# Patient Record
Sex: Female | Born: 1985 | Race: Black or African American | Hispanic: No | Marital: Single | Smoking: Former smoker
Health system: Southern US, Community
[De-identification: ages and names within clinical notes are randomized; demographics above are authoritative.]

## PROBLEM LIST (undated history)

## (undated) DIAGNOSIS — N189 Chronic kidney disease, unspecified: Secondary | ICD-10-CM

## (undated) HISTORY — PX: BREAST SURGERY: SHX581

## (undated) HISTORY — PX: WISDOM TOOTH EXTRACTION: SHX21

---
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
## 2010-08-28 ENCOUNTER — Emergency Department (HOSPITAL_COMMUNITY)
Admission: EM | Admit: 2010-08-28 | Discharge: 2010-08-28 | Disposition: A | Discharge status: Home / Self Care | Payer: Medicaid Other | Attending: Emergency Medicine | Admitting: Emergency Medicine

## 2010-08-28 DIAGNOSIS — N390 Urinary tract infection, site not specified: Secondary | ICD-10-CM | POA: Insufficient documentation

## 2010-08-28 DIAGNOSIS — N898 Other specified noninflammatory disorders of vagina: Secondary | ICD-10-CM | POA: Insufficient documentation

## 2010-08-28 DIAGNOSIS — Z202 Contact with and (suspected) exposure to infections with a predominantly sexual mode of transmission: Secondary | ICD-10-CM | POA: Insufficient documentation

## 2010-08-28 DIAGNOSIS — Z3201 Encounter for pregnancy test, result positive: Secondary | ICD-10-CM | POA: Insufficient documentation

## 2010-08-28 DIAGNOSIS — O2390 Unspecified genitourinary tract infection in pregnancy, unspecified trimester: Secondary | ICD-10-CM | POA: Insufficient documentation

## 2010-08-28 LAB — URINALYSIS, ROUTINE W REFLEX MICROSCOPIC
Bilirubin Urine: NEGATIVE
Glucose, UA: NEGATIVE mg/dL
Hgb urine dipstick: NEGATIVE
Ketones, ur: NEGATIVE mg/dL
Nitrite: POSITIVE — AB
Protein, ur: NEGATIVE mg/dL
Specific Gravity, Urine: 1.023 (ref 1.005–1.030)
Urobilinogen, UA: 1 mg/dL (ref 0.0–1.0)
pH: 6.5 (ref 5.0–8.0)

## 2010-08-28 LAB — WET PREP, GENITAL
Clue Cells Wet Prep HPF POC: NONE SEEN
Trich, Wet Prep: NONE SEEN
Yeast Wet Prep HPF POC: NONE SEEN

## 2010-08-28 LAB — URINE MICROSCOPIC-ADD ON

## 2010-08-28 LAB — POCT PREGNANCY, URINE: Preg Test, Ur: POSITIVE

## 2013-09-16 ENCOUNTER — Emergency Department (HOSPITAL_BASED_OUTPATIENT_CLINIC_OR_DEPARTMENT_OTHER)
Admission: EM | Admit: 2013-09-16 | Discharge: 2013-09-16 | Disposition: A | Discharge status: Home / Self Care | Payer: Medicaid Other | Attending: Emergency Medicine | Admitting: Emergency Medicine

## 2013-09-16 ENCOUNTER — Encounter (HOSPITAL_BASED_OUTPATIENT_CLINIC_OR_DEPARTMENT_OTHER): Payer: Self-pay | Admitting: Emergency Medicine

## 2013-09-16 ENCOUNTER — Emergency Department (HOSPITAL_BASED_OUTPATIENT_CLINIC_OR_DEPARTMENT_OTHER): Payer: Medicaid Other

## 2013-09-16 DIAGNOSIS — N760 Acute vaginitis: Secondary | ICD-10-CM | POA: Insufficient documentation

## 2013-09-16 DIAGNOSIS — O200 Threatened abortion: Secondary | ICD-10-CM | POA: Diagnosis not present

## 2013-09-16 DIAGNOSIS — Z87891 Personal history of nicotine dependence: Secondary | ICD-10-CM | POA: Diagnosis not present

## 2013-09-16 DIAGNOSIS — O368990 Maternal care for other specified fetal problems, unspecified trimester, not applicable or unspecified: Secondary | ICD-10-CM | POA: Diagnosis not present

## 2013-09-16 DIAGNOSIS — B9689 Other specified bacterial agents as the cause of diseases classified elsewhere: Secondary | ICD-10-CM | POA: Diagnosis not present

## 2013-09-16 DIAGNOSIS — Z880 Allergy status to penicillin: Secondary | ICD-10-CM | POA: Diagnosis not present

## 2013-09-16 DIAGNOSIS — O2390 Unspecified genitourinary tract infection in pregnancy, unspecified trimester: Secondary | ICD-10-CM | POA: Diagnosis not present

## 2013-09-16 DIAGNOSIS — B373 Candidiasis of vulva and vagina: Secondary | ICD-10-CM | POA: Insufficient documentation

## 2013-09-16 DIAGNOSIS — O468X1 Other antepartum hemorrhage, first trimester: Secondary | ICD-10-CM

## 2013-09-16 DIAGNOSIS — O98819 Other maternal infectious and parasitic diseases complicating pregnancy, unspecified trimester: Secondary | ICD-10-CM | POA: Diagnosis not present

## 2013-09-16 DIAGNOSIS — A499 Bacterial infection, unspecified: Secondary | ICD-10-CM | POA: Diagnosis not present

## 2013-09-16 DIAGNOSIS — O418X10 Other specified disorders of amniotic fluid and membranes, first trimester, not applicable or unspecified: Secondary | ICD-10-CM

## 2013-09-16 DIAGNOSIS — Z3491 Encounter for supervision of normal pregnancy, unspecified, first trimester: Secondary | ICD-10-CM

## 2013-09-16 DIAGNOSIS — O43899 Other placental disorders, unspecified trimester: Secondary | ICD-10-CM | POA: Diagnosis not present

## 2013-09-16 DIAGNOSIS — N390 Urinary tract infection, site not specified: Secondary | ICD-10-CM | POA: Insufficient documentation

## 2013-09-16 DIAGNOSIS — B3731 Acute candidiasis of vulva and vagina: Secondary | ICD-10-CM | POA: Insufficient documentation

## 2013-09-16 DIAGNOSIS — O2341 Unspecified infection of urinary tract in pregnancy, first trimester: Secondary | ICD-10-CM

## 2013-09-16 LAB — CBC WITH DIFFERENTIAL/PLATELET
BASOS PCT: 0 % (ref 0–1)
Basophils Absolute: 0 10*3/uL (ref 0.0–0.1)
EOS PCT: 1 % (ref 0–5)
Eosinophils Absolute: 0.1 10*3/uL (ref 0.0–0.7)
HEMATOCRIT: 40.2 % (ref 36.0–46.0)
HEMOGLOBIN: 14 g/dL (ref 12.0–15.0)
Lymphocytes Relative: 25 % (ref 12–46)
Lymphs Abs: 3.2 10*3/uL (ref 0.7–4.0)
MCH: 32.3 pg (ref 26.0–34.0)
MCHC: 34.8 g/dL (ref 30.0–36.0)
MCV: 92.8 fL (ref 78.0–100.0)
MONO ABS: 0.8 10*3/uL (ref 0.1–1.0)
MONOS PCT: 6 % (ref 3–12)
NEUTROS ABS: 8.6 10*3/uL — AB (ref 1.7–7.7)
Neutrophils Relative %: 68 % (ref 43–77)
Platelets: 235 10*3/uL (ref 150–400)
RBC: 4.33 MIL/uL (ref 3.87–5.11)
RDW: 13.3 % (ref 11.5–15.5)
WBC: 12.7 10*3/uL — ABNORMAL HIGH (ref 4.0–10.5)

## 2013-09-16 LAB — URINALYSIS, ROUTINE W REFLEX MICROSCOPIC
Bilirubin Urine: NEGATIVE
Glucose, UA: NEGATIVE mg/dL
Hgb urine dipstick: NEGATIVE
Ketones, ur: NEGATIVE mg/dL
Nitrite: NEGATIVE
Protein, ur: NEGATIVE mg/dL
Specific Gravity, Urine: 1.014 (ref 1.005–1.030)
Urobilinogen, UA: 0.2 mg/dL (ref 0.0–1.0)
pH: 7.5 (ref 5.0–8.0)

## 2013-09-16 LAB — COMPREHENSIVE METABOLIC PANEL
ALBUMIN: 3.7 g/dL (ref 3.5–5.2)
ALT: 12 U/L (ref 0–35)
ANION GAP: 14 (ref 5–15)
AST: 19 U/L (ref 0–37)
Alkaline Phosphatase: 55 U/L (ref 39–117)
BUN: 13 mg/dL (ref 6–23)
CALCIUM: 10.2 mg/dL (ref 8.4–10.5)
CHLORIDE: 99 meq/L (ref 96–112)
CO2: 25 meq/L (ref 19–32)
CREATININE: 0.8 mg/dL (ref 0.50–1.10)
GFR calc Af Amer: >90 mL/min (ref >90)
Glucose, Bld: 113 mg/dL — ABNORMAL HIGH (ref 70–99)
Potassium: 3.6 mEq/L — ABNORMAL LOW (ref 3.7–5.3)
Sodium: 138 mEq/L (ref 137–147)
Total Protein: 7.7 g/dL (ref 6.0–8.3)

## 2013-09-16 LAB — ABO/RH: ABO/RH(D): O POS

## 2013-09-16 LAB — HCG, QUANTITATIVE, PREGNANCY: HCG, BETA CHAIN, QUANT, S: 45988 m[IU]/mL — AB (ref <5)

## 2013-09-16 LAB — URINE MICROSCOPIC-ADD ON

## 2013-09-16 LAB — WET PREP, GENITAL: TRICH WET PREP: NONE SEEN

## 2013-09-16 LAB — PREGNANCY, URINE: Preg Test, Ur: POSITIVE — AB

## 2013-09-16 MED ORDER — LIDOCAINE HCL (PF) 1 % IJ SOLN
INTRAMUSCULAR | Status: AC
  Administered 2013-09-16: 2.1 mL
  Filled 2013-09-16: qty 5

## 2013-09-16 MED ORDER — METRONIDAZOLE 500 MG PO TABS: 500.0000 mg | ORAL_TABLET | Freq: Two times a day (BID) | ORAL | Status: DC

## 2013-09-16 MED ORDER — CLOTRIMAZOLE 1 % VA CREA: 1.0000 | TOPICAL_CREAM | Freq: Every day | VAGINAL | Status: DC

## 2013-09-16 MED ORDER — PRENATAL COMPLETE 14-0.4 MG PO TABS: 1.0000 | ORAL_TABLET | Freq: Every day | ORAL | Status: DC

## 2013-09-16 MED ORDER — AZITHROMYCIN 250 MG PO TABS
1000.0000 mg | ORAL_TABLET | Freq: Once | ORAL | Status: AC
  Administered 2013-09-16: 1000 mg via ORAL
  Filled 2013-09-16: qty 4

## 2013-09-16 MED ORDER — CEFTRIAXONE SODIUM 250 MG IJ SOLR
250.0000 mg | Freq: Once | INTRAMUSCULAR | Status: AC
  Administered 2013-09-16: 250 mg via INTRAMUSCULAR
  Filled 2013-09-16: qty 250

## 2013-09-16 MED ORDER — NITROFURANTOIN MONOHYD MACRO 100 MG PO CAPS
100.0000 mg | ORAL_CAPSULE | Freq: Two times a day (BID) | ORAL | Status: DC
Start: 2013-09-16 — End: 2014-04-16

## 2013-09-16 NOTE — ED Notes (Signed)
Pt presents to ED today with complaints of vaginal bleeding and [redacted] weeks pregnant. Pt states she is spotting since Friday.  G4 P3..Erin Leonard

## 2013-09-16 NOTE — ED Provider Notes (Signed)
CSN: [id number]     Arrival date & time 09/16/13  1219 History   First MD Initiated Contact with Patient 09/16/13 1418     Chief Complaint  Patient presents with  . Vaginal Bleeding  . Possible Pregnancy     (Consider location/radiation/quality/duration/timing/severity/associated sxs/prior Treatment) HPI Comments: Erin Leonard is a 28 y.o. G4P3 Female with no significant PMHx presenting today with complaints of vaginal bleeding that began Friday, and concerns of passage of tissue that began yesterday. She states that her last menstrual period was on 07/28/13, she is currently sexually active with one partner, and it is unprotected sex. Did not take a home pregnancy test prior to arrival and is unsure if she's pregnant. States that the vaginal bleeding is spotting, filling up <1 pad per day, and less than menses. States that the tissue looks like pink stringy tissue, denies any blood clots. She notices this bleeding every time she wipes, and it occurs spontaneously. Associated with the vaginal bleeding is mild intermittent nonradiating lower abdominal cramping and a back ache that began today. She does endorse that there is some vaginal discharge, although she's not sure if this is the tissue or any other discharge. She denies any vaginal itching or odors. Does endorse that her vaginal feels like there's pressure inside of it. She has not tried anything for her symptoms. She denies any bleeding disorders, gynecologic surgery, or terminated pregnancies. She denies any fevers, chills, dizziness, lightheadedness, chest pain, shortness of breath, dysuria, hematuria, dyspareunia, nausea, vomiting, diarrhea, fetal movement, breast tenderness, lactation, rashes, myalgias, or arthralgias. Does not know her blood type.  Patient is a 28 y.o. female presenting with vaginal bleeding. The history is provided by the patient. No language interpreter was used.  Vaginal Bleeding Quality:  Passed tissue and lighter  than menses Severity:  Mild Onset quality:  Sudden Duration:  2 days Timing:  Constant Progression:  Unchanged Chronicity:  New Menstrual history:  Missed period Number of pads used:  1 Number of tampons used:  0 Possible pregnancy: yes   Context: spontaneously   Relieved by:  None tried Worsened by:  Nothing tried Ineffective treatments:  None tried Associated symptoms: abdominal pain (low abd cramping), back pain (low back aching) and vaginal discharge   Associated symptoms: no dizziness, no dyspareunia, no dysuria, no fatigue, no fever and no nausea   Risk factors: unprotected sex   Risk factors: no bleeding disorder, no gynecological surgery, no new sexual partner and no terminated pregnancies     History reviewed. No pertinent past medical history. Past Surgical History  Procedure Laterality Date  . Breast surgery     History reviewed. No pertinent family history. History  Substance Use Topics  . Smoking status: Former Games developer  . Smokeless tobacco: Not on file  . Alcohol Use: Not on file   OB History   Grav Para Term Preterm Abortions TAB SAB Ect Mult Living                 Review of Systems  Constitutional: Negative for fever, chills and fatigue.  Respiratory: Negative for shortness of breath.   Cardiovascular: Negative for chest pain.  Gastrointestinal: Positive for abdominal pain (low abd cramping). Negative for nausea, vomiting, diarrhea, constipation, blood in stool and abdominal distention.  Genitourinary: Positive for vaginal bleeding, vaginal discharge, menstrual problem (missed period) and pelvic pain (lower abd cramping). Negative for dysuria, urgency, frequency, hematuria, flank pain, decreased urine volume, difficulty urinating, vaginal pain and dyspareunia.  Musculoskeletal: Positive  for back pain (low back aching). Negative for arthralgias and myalgias.  Skin: Negative for color change.  Neurological: Negative for dizziness, weakness, light-headedness  and headaches.  Hematological: Does not bruise/bleed easily.  Psychiatric/Behavioral: Negative for confusion.  10 Systems reviewed and are negative for acute change except as noted in the HPI.     Allergies  Penicillins  Home Medications   Prior to Admission medications   Medication Sig Start Date End Date Taking? Authorizing Provider  Prenatal Vit-Fe Fumarate-FA (PRENATAL MULTIVITAMIN) TABS tablet Take 1 tablet by mouth daily at 12 noon.   Yes Historical Provider, MD  clotrimazole (GYNE-LOTRIMIN) 1 % vaginal cream Place 1 Applicatorful vaginally at bedtime. For 7 days. 09/16/13   Caitlynne Harbeck Strupp Camprubi-Soms, PA-C  metroNIDAZOLE (FLAGYL) 500 MG tablet Take 1 tablet (500 mg total) by mouth 2 (two) times daily. One po bid x 7 days 09/16/13   Donnita Falls Camprubi-Soms, PA-C  nitrofurantoin, macrocrystal-monohydrate, (MACROBID) 100 MG capsule Take 1 capsule (100 mg total) by mouth 2 (two) times daily. X 7 days 09/16/13   Donnita Falls Camprubi-Soms, PA-C  Prenatal Vit-Fe Fumarate-FA (PRENATAL COMPLETE) 14-0.4 MG TABS Take 1 tablet by mouth daily after breakfast. 09/16/13   Donnita Falls Camprubi-Soms, PA-C   BP 115/63  Pulse 78  Temp(Src) 98.7 F (37.1 C) (Oral)  Resp 18  Ht 5\' 3"  (1.6 m)  Wt 150 lb (68.04 kg)  BMI 26.58 kg/m2  SpO2 100%  LMP 07/28/2013 Physical Exam  Nursing note and vitals reviewed. Constitutional: She is oriented to person, place, and time. Vital signs are normal. She appears well-developed and well-nourished.  Non-toxic appearance. No distress.  Afebrile, nontoxic, VSS, NAD  HENT:  Head: Normocephalic and atraumatic.  Mouth/Throat: Mucous membranes are normal.  Eyes: Conjunctivae and EOM are normal. Pupils are equal, round, and reactive to light. Right eye exhibits no discharge. Left eye exhibits no discharge.  Neck: Normal range of motion. Neck supple.  Cardiovascular: Normal rate, regular rhythm, normal heart sounds and intact distal pulses.   No murmur  heard. Pulmonary/Chest: Effort normal and breath sounds normal. No respiratory distress. She has no decreased breath sounds. She has no wheezes. She has no rhonchi. She has no rales.  Abdominal: Soft. Normal appearance and bowel sounds are normal. She exhibits no distension. There is tenderness in the suprapubic area. There is no rigidity, no rebound, no guarding, no CVA tenderness, no tenderness at McBurney's point and negative Murphy's sign.  Soft, nondistended, +BS throughout. Mild TTP at pelvic brim, near suprapubic region and into RLQ but with neg McBurney's point TTP. No r/g/r, no peritoneal signs. No CVA TTP  Genitourinary: Pelvic exam was performed with patient supine. There is no rash, tenderness, lesion or injury on the right labia. There is no rash, tenderness, lesion or injury on the left labia. Uterus is deviated and tender. Uterus is not enlarged and not fixed. Cervix exhibits discharge. Cervix exhibits no motion tenderness and no friability. Right adnexum displays no mass, no tenderness and no fullness. Left adnexum displays no mass, no tenderness and no fullness. No erythema or bleeding around the vagina. Vaginal discharge found.  Thick mucoid discharge within vaginal vault. No vaginal bleeding noted. No erythema of vaginal walls. Bilateral adnexa free of tenderness, fullness, or masses. No CMT or cervical friability, but with cervical discharge consistent with discharge seen in vaginal vault. Cervical os appears closed. There is no tissue noted at the cervical os. Uterus does appear to be slightly deviated to the right, but  is not fixed. Cannot feel any enlargement. It is mildly tender to palpation in the same place that she was tender prior to the internal digital exam, suprapubically.  Musculoskeletal: Normal range of motion.  Neurological: She is alert and oriented to person, place, and time.  Skin: Skin is warm, dry and intact. No rash noted. No pallor.  Psychiatric: She has a normal  mood and affect.    ED Course  Procedures (including critical care time) Labs Review Labs Reviewed  WET PREP, GENITAL - Abnormal; Notable for the following:    Yeast Wet Prep HPF POC FEW (*)    Clue Cells Wet Prep HPF POC FEW (*)    WBC, Wet Prep HPF POC TOO NUMEROUS TO COUNT (*)    All other components within normal limits  PREGNANCY, URINE - Abnormal; Notable for the following:    Preg Test, Ur POSITIVE (*)    All other components within normal limits  URINALYSIS, ROUTINE W REFLEX MICROSCOPIC - Abnormal; Notable for the following:    APPearance TURBID (*)    Leukocytes, UA TRACE (*)    All other components within normal limits  URINE MICROSCOPIC-ADD ON - Abnormal; Notable for the following:    Squamous Epithelial / LPF FEW (*)    Bacteria, UA MANY (*)    All other components within normal limits  HCG, QUANTITATIVE, PREGNANCY - Abnormal; Notable for the following:    hCG, Beta Chain, Quant, S 09604 (*)    All other components within normal limits  CBC WITH DIFFERENTIAL - Abnormal; Notable for the following:    WBC 12.7 (*)    Neutro Abs 8.6 (*)    All other components within normal limits  COMPREHENSIVE METABOLIC PANEL - Abnormal; Notable for the following:    Potassium 3.6 (*)    Glucose, Bld 113 (*)    Total Bilirubin <0.2 (*)    All other components within normal limits  GC/CHLAMYDIA PROBE AMP  URINE CULTURE  ABO/RH    Imaging Review US Ob Comp Less 14 Wks  09/16/2013   CLINICAL DATA:  Vaginal bleeding. Positive urine pregnancy test. Abdominal cramping. Seven weeks 1 day pregnant by last menstrual period. Quantitative beta HCG pending.  EXAM: OBSTETRIC <14 WK [place] AND TRANSVAGINAL OB US  TECHNIQUE: Both transabdominal and transvaginal ultrasound examinations were performed for complete evaluation of the gestation as well as the maternal uterus, adnexal regions, and pelvic cul-de-sac. Transvaginal technique was performed to assess early pregnancy.  COMPARISON:  None.   FINDINGS: Intrauterine gestational sac: Visualized/normal in shape.  Yolk sac:  Visualized/normal in shape.  Embryo:  Visualized  Cardiac Activity: Visualized  Heart Rate:  143 bpm  CRL:   10.8  mm   7 w 1 d                  [place] EDC: 05/04/2014  Maternal uterus/adnexae: Moderate-sized subchorionic hemorrhage. Small amount of free peritoneal fluid. Right ovarian corpus luteum. Normal left ovary transabdominally, not seen transvaginally.  IMPRESSION: 1. Single live intrauterine gestation with an estimated gestational age of [redacted] weeks 1 day. 2. Moderate-sized subchorionic hemorrhage. 3. Small amount of free peritoneal fluid.   Electronically Signed   By: Gordan Payment M.D.   On: 09/16/2013 16:33   US Ob Transvaginal  09/16/2013   CLINICAL DATA:  Vaginal bleeding. Positive urine pregnancy test. Abdominal cramping. Seven weeks 1 day pregnant by last menstrual period. Quantitative beta HCG pending.  EXAM: OBSTETRIC <14 WK [place] AND TRANSVAGINAL  OB US  TECHNIQUE: Both transabdominal and transvaginal ultrasound examinations were performed for complete evaluation of the gestation as well as the maternal uterus, adnexal regions, and pelvic cul-de-sac. Transvaginal technique was performed to assess early pregnancy.  COMPARISON:  None.  FINDINGS: Intrauterine gestational sac: Visualized/normal in shape.  Yolk sac:  Visualized/normal in shape.  Embryo:  Visualized  Cardiac Activity: Visualized  Heart Rate:  143 bpm  CRL:   10.8  mm   7 w 1 d                  [place] EDC: 05/04/2014  Maternal uterus/adnexae: Moderate-sized subchorionic hemorrhage. Small amount of free peritoneal fluid. Right ovarian corpus luteum. Normal left ovary transabdominally, not seen transvaginally.  IMPRESSION: 1. Single live intrauterine gestation with an estimated gestational age of [redacted] weeks 1 day. 2. Moderate-sized subchorionic hemorrhage. 3. Small amount of free peritoneal fluid.   Electronically Signed   By: Gordan Payment M.D.   On: 09/16/2013 16:33     EKG  Interpretation None      MDM   Final diagnoses:  Intrauterine normal pregnancy, first trimester  BV (bacterial vaginosis)  Yeast vaginitis  UTI in pregnancy, antepartum, first trimester  Threatened abortion in early pregnancy  Subchorionic hemorrhage in first trimester    27y/o G4P3 here for vaginal bleeding and unsure of pregnancy. Upreg obtained was positive, proceed with quant hcg. Labs ordered and U/S ordered. Pt stable and with VSS, doubt large blood loss. Will reassess after labs return.  5:00 PM Labs as below.  U/A: trace leuks, few squamous cells, many bacteria, but 0-2 WBC. Given that pt is pregnant, although I feel this may be a dirty catch, I will still treat for UTI and send for culture. Will start macrobid x7 days. CBC w/diff: mildly bumped WBC, doubt any infectious source given that pt is afebrile and nontoxic, do not feel this WBC count is significant; stable H/H CMP: K 3.6, no need to treat at this time. All other labs WNL U/S: single live IUP approx [redacted]w[redacted]d, with moderate subchorionic hemorrhage which could be the source of pt's bleeding. Discussed with pt that this could potentially lead to a miscarriage in the future but that the management is "watching and waiting". Will have her f/up with OBGYN for repeat eval. Discussed starting prenatal vitamins today.  6:15 PM Wet prep: +yeast, will tx with clotrimazole cream x7 days; +clue cells, will tx with metro 500mg  BID x7 d GC/CT: given that pt had significant amount of discharge on exam, and is pregnant, will tx today with rocephin 250mg  IM and 1g azithro Rh: pt is O+, no rhogam necessary Quant HCG: currently over the discriminatory zone, and within normal range for [redacted]w[redacted]d gestation. Will have pt go to OBGYN in 2 days for repeat draw to eval for proper rising level  Discussed all of the above findings with the pt. Pt has had care at [facility] in the past, therefore will refer back to them for prenatal care. Discussed that the  lab will call her if her results are positive. Will need to have repeat quant hcg in 2 days, as well as prenatal care. I explained the diagnosis and have given explicit precautions to return to the ER including for any other new or worsening symptoms. The patient understands and accepts the medical plan as it's been dictated and I have answered their questions. Discharge instructions concerning home care and prescriptions have been given. The patient is STABLE and is  discharged to home in good condition.  BP 115/63  Pulse 78  Temp(Src) 98.7 F (37.1 C) (Oral)  Resp 18  Ht 5\' 3"  (1.6 m)  Wt 150 lb (68.04 kg)  BMI 26.58 kg/m2  SpO2 100%  LMP 07/28/2013  Meds ordered this encounter  Medications  . Prenatal Vit-Fe Fumarate-FA (PRENATAL MULTIVITAMIN) TABS tablet    Sig: Take 1 tablet by mouth daily at 12 noon.  Marland Kitchen. azithromycin (ZITHROMAX) tablet 1,000 mg    Sig:   . cefTRIAXone (ROCEPHIN) injection 250 mg    Sig:     Order Specific Question:  Antibiotic Indication:    Answer:  STD  . nitrofurantoin, macrocrystal-monohydrate, (MACROBID) 100 MG capsule    Sig: Take 1 capsule (100 mg total) by mouth 2 (two) times daily. X 7 days    Dispense:  14 capsule    Refill:  0    Order Specific Question:  Supervising Provider    Answer:  Eber HongMILLER, BRIAN D [[id number]]  . metroNIDAZOLE (FLAGYL) 500 MG tablet    Sig: Take 1 tablet (500 mg total) by mouth 2 (two) times daily. One po bid x 7 days    Dispense:  14 tablet    Refill:  0    Order Specific Question:  Supervising Provider    Answer:  Eber HongMILLER, BRIAN D [[id number]]  . clotrimazole (GYNE-LOTRIMIN) 1 % vaginal cream    Sig: Place 1 Applicatorful vaginally at bedtime. For 7 days.    Dispense:  45 g    Refill:  0    Order Specific Question:  Supervising Provider    Answer:  Eber HongMILLER, BRIAN D [[id number]]  . Prenatal Vit-Fe Fumarate-FA (PRENATAL COMPLETE) 14-0.4 MG TABS    Sig: Take 1 tablet by mouth daily after breakfast.    Dispense:  30 each    Refill:  2     Order Specific Question:  Supervising Provider    Answer:  Eber HongMILLER, BRIAN D [[id number]]  . lidocaine (PF) (XYLOCAINE) 1 % injection    Sig:     Mabe, Steven   : cabinet override       Donnita FallsMercedes Strupp Camprubi-Soms, PA-C 09/16/13 1908

## 2013-09-16 NOTE — Discharge Instructions (Signed)
Your pregnancy is within the uterus, but could still be at risk for a subsequent miscarriage since you were found to have bleeding between the uterine wall and placenta. You will need to see the OBGYN in 2 days for a repeat quantitative HCG (pregnancy hormone) and recheck, as well as routine prenatal care. Your urine might have an infection, therefore start taking Macrobid as directed for 7 days. Stay well hydrated. Your vaginal swabs showed yeast, therefore you need to use clotrimazole cream as directed for 7 days. Your swab also showed bacterial vaginosis, therefore start taking Flagyl as directed x 7 days. Take prenatal vitamins daily. Follow up with [facility] STD clinic for future STD concerns or screenings. You have been treated for gonorrhea and chlamydia in the ER but the hospital will call you if lab is positive. If any worsening or changing symptoms occurs, return to the emergency department immediately. Avoid intercourse for the next 48 hours, and get plenty of rest.   Vaginal Bleeding During Pregnancy, First Trimester A small amount of bleeding (spotting) from the vagina is common in early pregnancy. Sometimes the bleeding is normal and is not a problem, and sometimes it is a sign of something serious. Be sure to tell your doctor about any bleeding from your vagina right away. HOME CARE  Watch your condition for any changes.  Follow your doctor's instructions about how active you can be.  If you are on bed rest:  You may need to stay in bed and only get up to use the bathroom.  You may be allowed to do some activities.  If you need help, make plans for someone to help you.  Write down:  The number of pads you use each day.  How often you change pads.  How soaked (saturated) your pads are.  Do not use tampons.  Do not douche.  Do not have sex or orgasms until your doctor says it is okay.  If you pass any tissue from your vagina, save the tissue so  you can show it to your doctor.  Only take medicines as told by your doctor.  Do not take aspirin because it can make you bleed.  Keep all follow-up visits as told by your doctor. GET HELP IF:   You bleed from your vagina.  You have cramps.  You have labor pains.  You have a fever that does not go away after you take medicine. GET HELP RIGHT AWAY IF:   You have very bad cramps in your back or belly (abdomen).  You pass large clots or tissue from your vagina.  You bleed more.  You feel light-headed or weak.  You pass out (faint).  You have chills.  You are leaking fluid or have a gush of fluid from your vagina.  You pass out while pooping (having a bowel movement). MAKE SURE YOU:  Understand these instructions.  Will watch your condition.  Will get help right away if you are not doing well or get worse. Document Released: 06/18/2013 Document Reviewed: 10/09/2012 [facility] Patient Information 2015 [place], [place]. This information is not intended to replace advice given to you by your health care provider. Make sure you discuss any questions you have with your health care provider.  First Trimester of Pregnancy The first trimester of pregnancy is from week 1 until the end of week 12 (months 1 through 3). During this time, your baby will begin to develop inside you. At 6-8 weeks, the eyes and  face are formed, and the heartbeat can be seen on ultrasound. At the end of 12 weeks, all the baby's organs are formed. Prenatal care is all the medical care you receive before the birth of your baby. Make sure you get good prenatal care and follow all of your doctor's instructions. HOME CARE  Medicines  Take medicine only as told by your doctor. Some medicines are safe and some are not during pregnancy.  Take your prenatal vitamins as told by your doctor.  Take medicine that helps you poop (stool softener) as needed if your doctor says it is okay. Diet  Eat regular, healthy  meals.  Your doctor will tell you the amount of weight gain that is right for you.  Avoid raw meat and uncooked cheese.  If you feel sick to your stomach (nauseous) or throw up (vomit):  Eat 4 or 5 small meals a day instead of 3 large meals.  Try eating a few soda crackers.  Drink liquids between meals instead of during meals.  If you have a hard time pooping (constipation):  Eat high-fiber foods like fresh vegetables, fruit, and whole grains.  Drink enough fluids to keep your pee (urine) clear or pale yellow. Activity and Exercise  Exercise only as told by your doctor. Stop exercising if you have cramps or pain in your lower belly (abdomen) or low back.  Try to avoid standing for long periods of time. Move your legs often if you must stand in one place for a long time.  Avoid heavy lifting.  Wear low-heeled shoes. Sit and stand up straight.  You can have sex unless your doctor tells you not to. Relief of Pain or Discomfort  Wear a good support bra if your breasts are sore.  Take warm water baths (sitz baths) to soothe pain or discomfort caused by hemorrhoids. Use hemorrhoid cream if your doctor says it is okay.  Rest with your legs raised if you have leg cramps or low back pain.  Wear support hose if you have puffy, bulging veins (varicose veins) in your legs. Raise (elevate) your feet for 15 minutes, 3-4 times a day. Limit salt in your diet. Prenatal Care  Schedule your prenatal visits by the twelfth week of pregnancy.  Write down your questions. Take them to your prenatal visits.  Keep all your prenatal visits as told by your doctor. Safety  Wear your seat belt at all times when driving.  Make a list of emergency phone numbers. The list should include numbers for family, friends, the hospital, and police and fire departments. General Tips  Ask your doctor for a referral to a local prenatal class. Begin classes no later than at the start of month 6 of your  pregnancy.  Ask for help if you need counseling or help with nutrition. Your doctor can give you advice or tell you where to go for help.  Do not use hot tubs, steam rooms, or saunas.  Do not douche or use tampons or scented sanitary pads.  Do not cross your legs for long periods of time.  Avoid litter boxes and soil used by cats.  Avoid all smoking, herbs, and alcohol. Avoid drugs not approved by your doctor.  Visit your dentist. At home, brush your teeth with a soft toothbrush. Be gentle when you floss. GET HELP IF:  You are dizzy.  You have mild cramps or pressure in your lower belly.  You have a nagging pain in your belly area.  You  continue to feel sick to your stomach, throw up, or have watery poop (diarrhea).  You have a bad smelling fluid coming from your vagina.  You have pain with peeing (urination).  You have increased puffiness (swelling) in your face, hands, legs, or ankles. GET HELP RIGHT AWAY IF:   You have a fever.  You are leaking fluid from your vagina.  You have spotting or bleeding from your vagina.  You have very bad belly cramping or pain.  You gain or lose weight rapidly.  You throw up blood. It may look like coffee grounds.  You are around people who have [place] measles, fifth disease, or chickenpox.  You have a very bad headache.  You have shortness of breath.  You have any kind of trauma, such as from a fall or a car accident. Document Released: 07/21/2007 Document Revised: 06/18/2013 Document Reviewed: 12/12/2012 [facility] Patient Information 2015 [place], [place]. This information is not intended to replace advice given to you by your health care provider. Make sure you discuss any questions you have with your health care provider.  Threatened Miscarriage A threatened miscarriage is when you have vaginal bleeding during your first 20 weeks of pregnancy but the pregnancy has not ended. Your doctor will do tests to make sure you are still  pregnant. The cause of the bleeding may not be known. This condition does not mean your pregnancy will end. It does increase the risk of it ending (complete miscarriage). HOME CARE   Make sure you keep all your doctor visits for prenatal care.  Get plenty of rest.  Do not have sex or use tampons if you have vaginal bleeding.  Do not douche.  Do not smoke or use drugs.  Do not drink alcohol.  Avoid caffeine. GET HELP IF:  You have light bleeding from your vagina.  You have belly pain or cramping.  You have a fever. GET HELP RIGHT AWAY IF:   You have heavy bleeding from your vagina.  You have clots of blood coming from your vagina.  You have bad pain or cramps in your low back or belly.  You have fever, chills, and bad belly pain. MAKE SURE YOU:   Understand these instructions.  Will watch your condition.  Will get help right away if you are not doing well or get worse. Document Released: 01/15/2008 Document Revised: 02/06/2013 Document Reviewed: 11/28/2012 [facility] Patient Information 2015 [place], [place]. This information is not intended to replace advice given to you by your health care provider. Make sure you discuss any questions you have with your health care provider.  Subchorionic Hematoma A subchorionic hematoma is a gathering of blood between the outer wall of the placenta and the inner wall of the womb (uterus). The placenta is the organ that connects the fetus to the wall of the uterus. The placenta performs the feeding, breathing (oxygen to the fetus), and waste removal (excretory work) of the fetus.  Subchorionic hematoma is the most common abnormality found on a result from ultrasonography done during the first trimester or early second trimester of pregnancy. If there has been little or no vaginal bleeding, early small hematomas usually shrink on their own and do not affect your baby or pregnancy. The blood is gradually absorbed over 1-2 weeks. When bleeding  starts later in pregnancy or the hematoma is larger or occurs in an older pregnant woman, the outcome may not be as good. Larger hematomas may get bigger, which increases the chances for miscarriage. Subchorionic  hematoma also increases the risk of premature detachment of the placenta from the uterus, preterm (premature) labor, and stillbirth. HOME CARE INSTRUCTIONS  Stay on bed rest if your health care provider recommends this. Although bed rest will not prevent more bleeding or prevent a miscarriage, your health care provider may recommend bed rest until you are advised otherwise.  Avoid heavy lifting (more than 10 lb [4.5 kg]), exercise, sexual intercourse, or douching as directed by your health care provider.  Keep track of the number of pads you use each day and how soaked (saturated) they are. Write down this information.  Do not use tampons.  Keep all follow-up appointments as directed by your health care provider. Your health care provider may ask you to have follow-up blood tests or ultrasound tests or both. SEEK IMMEDIATE MEDICAL CARE IF:  You have severe cramps in your stomach, back, abdomen, or pelvis.  You have a fever.  You pass large clots or tissue. Save any tissue for your health care provider to look at.  Your bleeding increases or you become lightheaded, feel weak, or have fainting episodes. Document Released: 05/19/2006 Document Revised: 06/18/2013 Document Reviewed: 08/31/2012 [facility] Patient Information 2015 [place], [place]. This information is not intended to replace advice given to you by your health care provider. Make sure you discuss any questions you have with your health care provider.  Human Chorionic Gonadotropin (hCG) This is a test to confirm and monitor pregnancy or to diagnose trophoblastic disease or germ cell tumors. As early as 10 days after a missed menstrual period (some methods can detect hCG even earlier, at one week after conception) or if your  caregiver thinks that your symptoms suggest ectopic pregnancy, a failing pregnancy, trophoblastic disease, or germ cell tumors. hCG is a protein produced in the placenta of a pregnant woman. A pregnancy test is a specific blood or urine test that can detect hCG and confirm pregnancy. This hormone is able to be detected 10 days after a missed menstrual period, the time period when the fertilized egg is implanted in the woman's uterus. With some methods, hCG can be detected even earlier, at one week after conception.  During the early weeks of pregnancy, hCG is important in maintaining function of the corpus luteum (the mass of cells that forms from a mature egg). Production of hCG increases steadily during the first trimester (8-10 weeks), peaking around the 10th week after the last menstrual cycle. Levels then fall slowly during the remainder of the pregnancy. hCG is no longer detectable within a few weeks after delivery. hCG is also produced by some germ cell tumors and increased levels are seen in trophoblastic disease. SAMPLE COLLECTION hCG is commonly detected in urine. The preferred specimen is a random urine sample collected first thing in the morning. hCG can also be measured in blood drawn from a vein in the arm. NORMAL FINDINGS Qualitative:negative in non-pregnant women; positive in pregnancy Quantitative:   Gestation less than 1 week: 5-50 Whole HCG (milli-international units/mL)  Gestation of 2 weeks: 50-500 Whole HCG (milli-international units/mL)  Gestation of 3 weeks: 100-10,000 Whole HCG (milli-international units/mL)  Gestation of 4 weeks: 1,000-30,000 Whole HCG (milli-international units/mL)  Gestation of 5 weeks 3,500-115,000 Whole HCG (milli-international units/mL)  Gestation of 6-8 weeks: 12,000-270,000 Whole HCG (milli-international units/mL)  Gestation of 12 weeks: 15,000-220,000 Whole HCG (milli-international units/mL)  Males and non-pregnant females: less than 5 Whole  HCG (milli-international units/mL) Beta subunit: depends on the method and test used Ranges for normal  findings may vary among different laboratories and hospitals. You should always check with your doctor after having lab work or other tests done to discuss the meaning of your test results and whether your values are considered within normal limits. MEANING OF TEST  Your caregiver will go over the test results with you and discuss the importance and meaning of your results, as well as treatment options and the need for additional tests if necessary. OBTAINING THE TEST RESULTS It is your responsibility to obtain your test results. Ask the lab or department performing the test when and how you will get your results. Document Released: 03/05/2004 Document Revised: 04/26/2011 Document Reviewed: 05/07/2013 [facility] Patient Information 2015 [place], [place]. This information is not intended to replace advice given to you by your health care provider. Make sure you discuss any questions you have with your health care provider.  Pregnancy and Urinary Tract Infection A urinary tract infection (UTI) is a bacterial infection of the urinary tract. Infection of the urinary tract can include the ureters, kidneys (pyelonephritis), bladder (cystitis), and urethra (urethritis). All pregnant women should be screened for bacteria in the urinary tract. Identifying and treating a UTI will decrease the risk of preterm labor and developing more serious infections in both the mother and baby. CAUSES Bacteria germs cause almost all UTIs.  RISK FACTORS Many factors can increase your chances of getting a UTI during pregnancy. These include:  Having a short urethra.  Poor toilet and hygiene habits.  Sexual intercourse.  Blockage of urine along the urinary tract.  Problems with the pelvic muscles or nerves.  Diabetes.  Obesity.  Bladder problems after having several children.  Previous history of UTI. SIGNS AND  SYMPTOMS   Pain, burning, or a stinging feeling when urinating.  Suddenly feeling the need to urinate right away (urgency).  Loss of bladder control (urinary incontinence).  Frequent urination, more than is common with pregnancy.  Lower abdominal or back discomfort.  Cloudy urine.  Blood in the urine (hematuria).  Fever. When the kidneys are infected, the symptoms may be:  Back pain.  Flank pain on the right side more so than the left.  Fever.  Chills.  Nausea.  Vomiting. DIAGNOSIS  A urinary tract infection is usually diagnosed through urine tests. Additional tests and procedures are sometimes done. These may include:  Ultrasound exam of the kidneys, ureters, bladder, and urethra.  Looking in the bladder with a lighted tube (cystoscopy). TREATMENT Typically, UTIs can be treated with antibiotic medicines.  HOME CARE INSTRUCTIONS   Only take over-the-counter or prescription medicines as directed by your health care provider. If you were prescribed antibiotics, take them as directed. Finish them even if you start to feel better.  Drink enough fluids to keep your urine clear or pale yellow.  Do not have sexual intercourse until the infection is gone and your health care provider says it is okay.  Make sure you are tested for UTIs throughout your pregnancy. These infections often come back. Preventing a UTI in the Future  Practice good toilet habits. Always wipe from front to back. Use the tissue only once.  Do not hold your urine. Empty your bladder as soon as possible when the urge comes.  Do not douche or use deodorant sprays.  Wash with soap and warm water around the genital area and the anus.  Empty your bladder before and after sexual intercourse.  Wear underwear with a cotton crotch.  Avoid caffeine and carbonated drinks. They can irritate  the bladder.  Drink cranberry juice or take cranberry pills. This may decrease the risk of getting a UTI.  Do  not drink alcohol.  Keep all your appointments and tests as scheduled. SEEK MEDICAL CARE IF:   Your symptoms get worse.  You are still having fevers 2 or more days after treatment begins.  You have a rash.  You feel that you are having problems with medicines prescribed.  You have abnormal vaginal discharge. SEEK IMMEDIATE MEDICAL CARE IF:   You have back or flank pain.  You have chills.  You have blood in your urine.  You have nausea and vomiting.  You have contractions of your uterus.  You have a gush of fluid from the vagina. MAKE SURE YOU:  Understand these instructions.   Will watch your condition.   Will get help right away if you are not doing well or get worse.  Document Released: 05/29/2010 Document Revised: 11/22/2012 Document Reviewed: 08/31/2012 [facility] Patient Information 2015 [place], [place]. This information is not intended to replace advice given to you by your health care provider. Make sure you discuss any questions you have with your health care provider.  Candidal Vulvovaginitis Candidal vulvovaginitis is an infection of the vagina and vulva. The vulva is the skin around the opening of the vagina. This may cause itching and discomfort in and around the vagina.  HOME CARE  Only take medicine as told by your doctor.  Do not have sex (intercourse) until the infection is healed or as told by your doctor.  Practice safe sex.  Tell your sex partner about your infection.  Do not douche or use tampons.  Wear cotton underwear. Do not wear tight pants or panty hose.  Eat yogurt. This may help treat and prevent yeast infections. GET HELP RIGHT AWAY IF:   You have a fever.  Your problems get worse during treatment or do not get better in 3 days.  You have discomfort, irritation, or itching in your vagina or vulva area.  You have pain after sex.  You start to get belly (abdominal) pain. MAKE SURE YOU:  Understand these  instructions.  Will watch your condition.  Will get help right away if you are not doing well or get worse. Document Released: 04/30/2008 Document Revised: 02/06/2013 Document Reviewed: 04/30/2008 [facility] Patient Information 2015 [place], [place]. This information is not intended to replace advice given to you by your health care provider. Make sure you discuss any questions you have with your health care provider.  Bacterial Vaginosis Bacterial vaginosis is an infection of the vagina. It happens when too many of certain germs (bacteria) grow in the vagina. HOME CARE  Take your medicine as told by your doctor.  Finish your medicine even if you start to feel better.  Do not have sex until you finish your medicine and are better.  Tell your sex partner that you have an infection. They should see their doctor for treatment.  Practice safe sex. Use condoms. Have only one sex partner. GET HELP IF:  You are not getting better after 3 days of treatment.  You have more grey fluid (discharge) coming from your vagina than before.  You have more pain than before.  You have a fever. MAKE SURE YOU:   Understand these instructions.  Will watch your condition.  Will get help right away if you are not doing well or get worse. Document Released: 11/11/2007 Document Revised: 11/22/2012 Document Reviewed: 09/13/2012 [facility] Patient Information 2015 [place], [place].  This information is not intended to replace advice given to you by your health care provider. Make sure you discuss any questions you have with your health care provider.  Sexually Transmitted Disease A sexually transmitted disease (STD) is a disease or infection often passed to another person during sex. However, STDs can be passed through nonsexual ways. An STD can be passed through:  Spit (saliva).  Semen.  Blood.  Mucus from the vagina.  Pee (urine). HOW CAN I LESSEN MY CHANCES OF GETTING AN STD?  Use:  Latex  condoms.  Water-soluble lubricants with condoms. Do not use petroleum jelly or oils.  Dental dams. These are small pieces of latex that are used as a barrier during oral sex.  Avoid having more than one sex partner.  Do not have sex with someone who has other sex partners.  Do not have sex with anyone you do not know or who is at high risk for an STD.  Avoid risky sex that can break your skin.  Do not have sex if you have open sores on your mouth or skin.  Avoid drinking too much alcohol or taking illegal drugs. Alcohol and drugs can affect your good judgment.  Avoid oral and anal sex acts.  Get shots (vaccines) for HPV and hepatitis.  If you are at risk of being infected with HIV, it is advised that you take a certain medicine daily to prevent HIV infection. This is called pre-exposure prophylaxis (PrEP). You may be at risk if:  You are a man who has sex with other men (MSM).  You are attracted to the opposite sex (heterosexual) and are having sex with more than one partner.  You take drugs with a needle.  You have sex with someone who has HIV.  Talk with your doctor about if you are at high risk of being infected with HIV. If you begin to take PrEP, get tested for HIV first. Get tested every 3 months for as long as you are taking PrEP. WHAT SHOULD I DO IF I THINK I HAVE AN STD?  See your doctor.  Tell your sex partner(s) that you have an STD. They should be tested and treated.  Do not have sex until your doctor says it is okay. WHEN SHOULD I GET HELP? Get help right away if:  You have bad belly (abdominal) pain.  You are a man and have puffiness (swelling) or pain in your testicles.  You are a woman and have puffiness in your vagina. Document Released: 03/11/2004 Document Revised: 02/06/2013 Document Reviewed: 07/28/2012 [facility] Patient Information 2015 [place], [place]. This information is not intended to replace advice given to you by your health care provider.  Make sure you discuss any questions you have with your health care provider.

## 2013-09-16 NOTE — ED Notes (Signed)
PT discharged to home with family. NAD. 

## 2013-09-16 NOTE — ED Notes (Signed)
PT taken to ultrasound.  

## 2013-09-17 LAB — GC/CHLAMYDIA PROBE AMP
CT Probe RNA: NEGATIVE
GC PROBE AMP APTIMA: POSITIVE — AB

## 2013-09-18 ENCOUNTER — Telehealth (HOSPITAL_BASED_OUTPATIENT_CLINIC_OR_DEPARTMENT_OTHER): Payer: Self-pay | Admitting: Emergency Medicine

## 2013-09-18 LAB — URINE CULTURE

## 2013-09-18 NOTE — Telephone Encounter (Signed)
+  Gonorrhea. Patient treated with Rocephin and [facility]ax[facility]

## 2013-09-18 NOTE — ED Provider Notes (Signed)
Medical screening examination/treatment/procedure(s) were performed by non-physician practitioner and as supervising physician I was immediately available for consultation/collaboration.   EKG Interpretation None        Junius ArgyleForrest S Deannah Rossi, MD 09/18/13 1328

## 2013-10-03 LAB — OB RESULTS CONSOLE RPR: RPR: NONREACTIVE

## 2013-10-03 LAB — OB RESULTS CONSOLE RUBELLA ANTIBODY, IGM: Rubella: IMMUNE

## 2013-10-03 LAB — OB RESULTS CONSOLE ABO/RH: RH Type: POSITIVE

## 2013-10-03 LAB — OB RESULTS CONSOLE HIV ANTIBODY (ROUTINE TESTING): HIV: NONREACTIVE

## 2013-10-03 LAB — OB RESULTS CONSOLE HEPATITIS B SURFACE ANTIGEN: Hepatitis B Surface Ag: NEGATIVE

## 2013-10-03 LAB — OB RESULTS CONSOLE ANTIBODY SCREEN: ANTIBODY SCREEN: NEGATIVE

## 2013-10-03 LAB — OB RESULTS CONSOLE GC/CHLAMYDIA
Chlamydia: NEGATIVE
GC PROBE AMP, GENITAL: NEGATIVE

## 2013-10-04 DIAGNOSIS — R87619 Unspecified abnormal cytological findings in specimens from cervix uteri: Secondary | ICD-10-CM | POA: Insufficient documentation

## 2014-02-15 NOTE — L&D Delivery Note (Signed)
Delivery Note Pt progressed to complete dilation and pushed great.  At 10:09 PM a healthy female was delivered via  (Presentation: OA ).  APGAR: 8,9 ; weight pending .   Placenta status:delivered spontaneously , .  Cord:  with the following complications:double nuchal cord delivered through and then reduced .    Anesthesia: Epidural  Episiotomy:  none Lacerations: one  Suture Repair: na Est. Blood Loss (mL): 300cc   Mom to postpartum.  Baby to Couplet care / Skin to Skin. D/w pt circumcision and she plans to proceed in office.  Oliver PilaICHARDSON,Narjis Mira W 04/14/2014, 10:20 PM

## 2014-04-14 ENCOUNTER — Inpatient Hospital Stay (HOSPITAL_COMMUNITY): Payer: Medicaid Other | Admitting: Anesthesiology

## 2014-04-14 ENCOUNTER — Encounter (HOSPITAL_COMMUNITY): Payer: Self-pay | Admitting: *Deleted

## 2014-04-14 ENCOUNTER — Inpatient Hospital Stay (HOSPITAL_COMMUNITY)
Admission: AD | Admit: 2014-04-14 | Discharge: 2014-04-16 | DRG: 775 | Disposition: A | Discharge status: Home / Self Care | Payer: Medicaid Other | Source: Ambulatory Visit | Attending: Obstetrics and Gynecology | Admitting: Obstetrics and Gynecology

## 2014-04-14 DIAGNOSIS — Z3A37 37 weeks gestation of pregnancy: Secondary | ICD-10-CM | POA: Diagnosis present

## 2014-04-14 DIAGNOSIS — O99334 Smoking (tobacco) complicating childbirth: Secondary | ICD-10-CM | POA: Diagnosis present

## 2014-04-14 DIAGNOSIS — N858 Other specified noninflammatory disorders of uterus: Secondary | ICD-10-CM | POA: Diagnosis present

## 2014-04-14 DIAGNOSIS — O9989 Other specified diseases and conditions complicating pregnancy, childbirth and the puerperium: Secondary | ICD-10-CM | POA: Diagnosis present

## 2014-04-14 DIAGNOSIS — R87612 Low grade squamous intraepithelial lesion on cytologic smear of cervix (LGSIL): Secondary | ICD-10-CM | POA: Diagnosis present

## 2014-04-14 DIAGNOSIS — F17210 Nicotine dependence, cigarettes, uncomplicated: Secondary | ICD-10-CM | POA: Diagnosis present

## 2014-04-14 DIAGNOSIS — O99824 Streptococcus B carrier state complicating childbirth: Principal | ICD-10-CM | POA: Diagnosis present

## 2014-04-14 DIAGNOSIS — Z880 Allergy status to penicillin: Secondary | ICD-10-CM | POA: Diagnosis not present

## 2014-04-14 DIAGNOSIS — Z23 Encounter for immunization: Secondary | ICD-10-CM

## 2014-04-14 DIAGNOSIS — IMO0001 Reserved for inherently not codable concepts without codable children: Secondary | ICD-10-CM

## 2014-04-14 HISTORY — DX: Chronic kidney disease, unspecified: N18.9

## 2014-04-14 LAB — CBC
HEMATOCRIT: 36.8 % (ref 36.0–46.0)
Hemoglobin: 12 g/dL (ref 12.0–15.0)
MCH: 29.4 pg (ref 26.0–34.0)
MCHC: 32.6 g/dL (ref 30.0–36.0)
MCV: 90.2 fL (ref 78.0–100.0)
PLATELETS: 216 10*3/uL (ref 150–400)
RBC: 4.08 MIL/uL (ref 3.87–5.11)
RDW: 15.2 % (ref 11.5–15.5)
WBC: 12.5 10*3/uL — ABNORMAL HIGH (ref 4.0–10.5)

## 2014-04-14 LAB — ABO/RH: ABO/RH(D): O POS

## 2014-04-14 LAB — TYPE AND SCREEN
ABO/RH(D): O POS
Antibody Screen: NEGATIVE

## 2014-04-14 LAB — OB RESULTS CONSOLE GBS: STREP GROUP B AG: POSITIVE

## 2014-04-14 MED ORDER — PHENYLEPHRINE 40 MCG/ML (10ML) SYRINGE FOR IV PUSH (FOR BLOOD PRESSURE SUPPORT)
80.0000 ug | PREFILLED_SYRINGE | INTRAVENOUS | Status: DC | PRN
  Filled 2014-04-14: qty 2
  Filled 2014-04-14: qty 20

## 2014-04-14 MED ORDER — ONDANSETRON HCL 4 MG/2ML IJ SOLN: 4.0000 mg | Freq: Four times a day (QID) | INTRAMUSCULAR | Status: DC | PRN

## 2014-04-14 MED ORDER — PHENYLEPHRINE 40 MCG/ML (10ML) SYRINGE FOR IV PUSH (FOR BLOOD PRESSURE SUPPORT)
80.0000 ug | PREFILLED_SYRINGE | INTRAVENOUS | Status: DC | PRN
Start: 2014-04-14 — End: 2014-04-15
  Filled 2014-04-14: qty 2

## 2014-04-14 MED ORDER — TERBUTALINE SULFATE 1 MG/ML IJ SOLN
0.2500 mg | Freq: Once | INTRAMUSCULAR | Status: AC | PRN
  Filled 2014-04-14: qty 1

## 2014-04-14 MED ORDER — LIDOCAINE HCL (PF) 1 % IJ SOLN
INTRAMUSCULAR | Status: DC | PRN
  Administered 2014-04-14 (×2): 5 mL

## 2014-04-14 MED ORDER — EPHEDRINE 5 MG/ML INJ
10.0000 mg | INTRAVENOUS | Status: DC | PRN
  Filled 2014-04-14: qty 2

## 2014-04-14 MED ORDER — LACTATED RINGERS IV SOLN
INTRAVENOUS | Status: DC
  Administered 2014-04-14 (×2): via INTRAVENOUS

## 2014-04-14 MED ORDER — LACTATED RINGERS IV SOLN: 500.0000 mL | INTRAVENOUS | Status: DC | PRN

## 2014-04-14 MED ORDER — OXYTOCIN 40 UNITS IN LACTATED RINGERS INFUSION - SIMPLE MED
1.0000 m[IU]/min | INTRAVENOUS | Status: DC
  Administered 2014-04-14: 2 m[IU]/min via INTRAVENOUS
  Administered 2014-04-14: 4 m[IU]/min via INTRAVENOUS

## 2014-04-14 MED ORDER — OXYTOCIN 40 UNITS IN LACTATED RINGERS INFUSION - SIMPLE MED
62.5000 mL/h | INTRAVENOUS | Status: DC
  Filled 2014-04-14: qty 1000

## 2014-04-14 MED ORDER — BUTORPHANOL TARTRATE 1 MG/ML IJ SOLN: 1.0000 mg | INTRAMUSCULAR | Status: DC | PRN

## 2014-04-14 MED ORDER — FENTANYL 2.5 MCG/ML BUPIVACAINE 1/10 % EPIDURAL INFUSION (WH - ANES)
14.0000 mL/h | INTRAMUSCULAR | Status: DC | PRN
  Administered 2014-04-14: 14 mL/h via EPIDURAL
  Filled 2014-04-14: qty 125

## 2014-04-14 MED ORDER — LIDOCAINE HCL (PF) 1 % IJ SOLN
30.0000 mL | INTRAMUSCULAR | Status: DC | PRN
  Filled 2014-04-14: qty 30

## 2014-04-14 MED ORDER — OXYCODONE-ACETAMINOPHEN 5-325 MG PO TABS: 1.0000 | ORAL_TABLET | ORAL | Status: DC | PRN

## 2014-04-14 MED ORDER — DIPHENHYDRAMINE HCL 50 MG/ML IJ SOLN
25.0000 mg | Freq: Once | INTRAMUSCULAR | Status: AC
  Administered 2014-04-14: 25 mg via INTRAVENOUS

## 2014-04-14 MED ORDER — OXYCODONE-ACETAMINOPHEN 5-325 MG PO TABS: 2.0000 | ORAL_TABLET | ORAL | Status: DC | PRN

## 2014-04-14 MED ORDER — ACETAMINOPHEN 325 MG PO TABS: 650.0000 mg | ORAL_TABLET | ORAL | Status: DC | PRN

## 2014-04-14 MED ORDER — LACTATED RINGERS IV SOLN: 500.0000 mL | Freq: Once | INTRAVENOUS | Status: DC

## 2014-04-14 MED ORDER — OXYTOCIN BOLUS FROM INFUSION
500.0000 mL | INTRAVENOUS | Status: DC
  Administered 2014-04-14: 500 mL via INTRAVENOUS

## 2014-04-14 MED ORDER — DIPHENHYDRAMINE HCL 50 MG/ML IJ SOLN
12.5000 mg | INTRAMUSCULAR | Status: DC | PRN
  Filled 2014-04-14: qty 1

## 2014-04-14 MED ORDER — CITRIC ACID-SODIUM CITRATE 334-500 MG/5ML PO SOLN: 30.0000 mL | ORAL | Status: DC | PRN

## 2014-04-14 MED ORDER — VANCOMYCIN HCL IN DEXTROSE 1-5 GM/200ML-% IV SOLN
1000.0000 mg | Freq: Two times a day (BID) | INTRAVENOUS | Status: DC
  Administered 2014-04-14: 1000 mg via INTRAVENOUS
  Filled 2014-04-14 (×3): qty 200

## 2014-04-14 NOTE — Anesthesia Preprocedure Evaluation (Signed)

## 2014-04-14 NOTE — MAU Note (Signed)
[facility]. Will go to 170

## 2014-04-14 NOTE — Progress Notes (Signed)
Patient ID: Erin HurtLakeshia Leonard, female   DOB: [birth year], 29 y.o.   MRN: [id number] Pt comfortable with epidural afeb vss FHR Category 1  AROM clear fluid Cervix 80/6/-2  Will follow progress.

## 2014-04-14 NOTE — Progress Notes (Signed)
Patient ID: Erin HurtLakeshia Leonard, female   DOB: 25-Sep-1985, 29 y.o.   MRN: [id number] Pt comfortable with epidural afeb vss FHR category 1 Cervix 80/6-7/-1  IUPC placed to allow pitocin augmentation Follow progress.

## 2014-04-14 NOTE — MAU Note (Signed)
Pt presents to MAU with complaints of contractions that started this morning around 10am and have gotten stronger throughout the day. Denies any vaginal bleeding or LOF

## 2014-04-14 NOTE — H&P (Signed)
Erin HurtLakeshia Bagnall is a 29 y.o. female G4P3003 at 4837 1/7 weeks (EDD 05/04/14 by LMP c/w 6 week US) presenting for regular contractions in active labor.  Prenatal care complicated by placenta previa which resolved on f/u scan.  +GBS with PCN allergy--clindamycin resistant.   LGSIL pap with colpo consistent, to f/u postpartum.  Maternal Medical History:  Reason for admission: Contractions.   Contractions: Onset was 6-12 hours ago.   Frequency: regular.   Perceived severity is strong.    Fetal activity: Perceived fetal activity is normal.    Prenatal Complications - Diabetes: none.    OB History    Gravida Para Term Preterm AB TAB SAB Ectopic Multiple Living   4 3 3       3     2005 NSVD 6#5oz 2011 NSVD 7# 2013 NSVD 7#5oz  Past Medical History  Diagnosis Date  . Chronic kidney disease     kidney stone in 2015   Past Surgical History  Procedure Laterality Date  . Breast surgery    . Wisdom tooth extraction     Family History: family history is not on file. Social History:  reports that she quit smoking about 6 months ago. Her smoking use included Cigarettes. She does not have any smokeless tobacco history on file. She reports that she does not drink alcohol or use illicit drugs.   Prenatal Transfer Tool  Maternal Diabetes: No Genetic Screening: Declined Maternal Ultrasounds/Referrals: Normal Fetal Ultrasounds or other Referrals:  None Maternal Substance Abuse:  Yes:  Type: Smoker Significant Maternal Medications:  None Significant Maternal Lab Results:  Lab values include: Group B Strep positive Other Comments:  None  ROS  Dilation: 6 Effacement (%): 60 Station: -3 Exam by:: Elie ConferK. Weiss RN Blood pressure 116/70, pulse 76, temperature 98.2 F (36.8 C), resp. rate 18, height 5\' 3"  (1.6 m), weight 86.637 kg (191 lb), last menstrual period 07/28/2013. Maternal Exam:  Uterine Assessment: Contraction strength is firm.  Contraction frequency is regular.   Abdomen: Patient  reports no abdominal tenderness. Fetal presentation: vertex  Introitus: Normal vulva. Normal vagina.    Physical Exam  Constitutional: She appears well-developed and well-nourished.  Cardiovascular: Normal rate.   Respiratory: Effort normal.  GI: Soft.  Genitourinary: Vagina normal and uterus normal.  Neurological: She is alert.  Psychiatric: She has a normal mood and affect.    Prenatal labs: ABO, Rh: --/--/O POS (08/02 1620) Antibody:  negative Rubella:  immune RPR:   neg HBsAg:  neg  HIV:   NR GBS:   Positive and resistant to clindamycin One hour GTT 114 HgbAA LGSIL pap  Assessment/Plan: Pt admitted in active labor.  Will treat +GBS with vancomycin.  Pt gets hives with PCN and unsure if can take cephalosporins.  Will try to get epidural on board.   Oliver PilaRICHARDSON,Karisma Meiser W 04/14/2014, 3:18 PM

## 2014-04-14 NOTE — Progress Notes (Signed)
Patient ID: Erin HurtLakeshia Leonard, female   DOB: April 30, 1985, 29 y.o.   MRN: [id number] Pt admitted and getting increasingly uncomfortable, wants epidural afeb vss FHR category 1 Patient 1cm in office last visit and now at 5cm/70/-2  Will allow epidural Vancomycin going in for +GBS Plan AROM after epidural

## 2014-04-14 NOTE — Anesthesia Procedure Notes (Signed)
Epidural Patient location during procedure: OB Start time: 04/14/2014 4:56 PM  Staffing Anesthesiologist: Brayton CavesJACKSON, Garron Eline Performed by: anesthesiologist   Preanesthetic Checklist Completed: patient identified, site marked, surgical consent, pre-op evaluation, timeout performed, IV checked, risks and benefits discussed and monitors and equipment checked  Epidural Patient position: sitting Prep: site prepped and draped and DuraPrep Patient monitoring: continuous pulse ox and blood pressure Approach: midline Location: L3-L4 Injection technique: LOR air  Needle:  Needle type: Tuohy  Needle gauge: 17 G Needle length: 9 cm and 9 Needle insertion depth: 6 cm Catheter type: closed end flexible Catheter size: 19 Gauge Catheter at skin depth: 10 cm Test dose: negative  Assessment Events: blood not aspirated, injection not painful, no injection resistance, negative IV test and no paresthesia  Additional Notes Patient identified.  Risk benefits discussed including failed block, incomplete pain control, headache, nerve damage, paralysis, blood pressure changes, nausea, vomiting, reactions to medication both toxic or allergic, and postpartum back pain.  Patient expressed understanding and wished to proceed.  All questions were answered.  Sterile technique used throughout procedure and epidural site dressed with sterile barrier dressing. No paresthesia or other complications noted.The patient did not experience any signs of intravascular injection such as tinnitus or metallic taste in mouth nor signs of intrathecal spread such as rapid motor block. Please see nursing notes for vital signs.

## 2014-04-15 ENCOUNTER — Encounter (HOSPITAL_COMMUNITY): Payer: Self-pay | Admitting: *Deleted

## 2014-04-15 LAB — HIV ANTIBODY (ROUTINE TESTING W REFLEX): HIV Screen 4th Generation wRfx: NONREACTIVE

## 2014-04-15 LAB — CBC
HEMATOCRIT: 30.7 % — AB (ref 36.0–46.0)
Hemoglobin: 10.2 g/dL — ABNORMAL LOW (ref 12.0–15.0)
MCH: 29.7 pg (ref 26.0–34.0)
MCHC: 33.2 g/dL (ref 30.0–36.0)
MCV: 89.5 fL (ref 78.0–100.0)
PLATELETS: 177 10*3/uL (ref 150–400)
RBC: 3.43 MIL/uL — AB (ref 3.87–5.11)
RDW: 15.2 % (ref 11.5–15.5)
WBC: 13.2 10*3/uL — AB (ref 4.0–10.5)

## 2014-04-15 MED ORDER — OXYCODONE-ACETAMINOPHEN 5-325 MG PO TABS
1.0000 | ORAL_TABLET | ORAL | Status: DC | PRN
  Administered 2014-04-15 (×3): 1 via ORAL
  Filled 2014-04-15 (×2): qty 1

## 2014-04-15 MED ORDER — ONDANSETRON HCL 4 MG/2ML IJ SOLN: 4.0000 mg | INTRAMUSCULAR | Status: DC | PRN

## 2014-04-15 MED ORDER — IBUPROFEN 600 MG PO TABS
600.0000 mg | ORAL_TABLET | Freq: Four times a day (QID) | ORAL | Status: DC
  Administered 2014-04-15 – 2014-04-16 (×6): 600 mg via ORAL
  Filled 2014-04-15 (×6): qty 1

## 2014-04-15 MED ORDER — TETANUS-DIPHTH-ACELL PERTUSSIS 5-2.5-18.5 LF-MCG/0.5 IM SUSP: 0.5000 mL | Freq: Once | INTRAMUSCULAR | Status: DC

## 2014-04-15 MED ORDER — SENNOSIDES-DOCUSATE SODIUM 8.6-50 MG PO TABS
2.0000 | ORAL_TABLET | ORAL | Status: DC
  Administered 2014-04-15 (×2): 2 via ORAL
  Filled 2014-04-15 (×2): qty 2

## 2014-04-15 MED ORDER — ZOLPIDEM TARTRATE 5 MG PO TABS: 5.0000 mg | ORAL_TABLET | Freq: Every evening | ORAL | Status: DC | PRN

## 2014-04-15 MED ORDER — WITCH HAZEL-GLYCERIN EX PADS: 1.0000 "application " | MEDICATED_PAD | CUTANEOUS | Status: DC | PRN

## 2014-04-15 MED ORDER — BENZOCAINE-MENTHOL 20-0.5 % EX AERO
1.0000 "application " | INHALATION_SPRAY | CUTANEOUS | Status: DC | PRN
  Administered 2014-04-15: 1 via TOPICAL
  Filled 2014-04-15: qty 56

## 2014-04-15 MED ORDER — OXYCODONE-ACETAMINOPHEN 5-325 MG PO TABS
2.0000 | ORAL_TABLET | ORAL | Status: DC | PRN
  Filled 2014-04-15: qty 2

## 2014-04-15 MED ORDER — DIBUCAINE 1 % RE OINT: 1.0000 "application " | TOPICAL_OINTMENT | RECTAL | Status: DC | PRN

## 2014-04-15 MED ORDER — LANOLIN HYDROUS EX OINT: TOPICAL_OINTMENT | CUTANEOUS | Status: DC | PRN

## 2014-04-15 MED ORDER — DIPHENHYDRAMINE HCL 25 MG PO CAPS: 25.0000 mg | ORAL_CAPSULE | Freq: Four times a day (QID) | ORAL | Status: DC | PRN

## 2014-04-15 MED ORDER — PRENATAL MULTIVITAMIN CH
1.0000 | ORAL_TABLET | Freq: Every day | ORAL | Status: DC
Start: 2014-04-15 — End: 2014-04-16
  Administered 2014-04-16: 1 via ORAL
  Filled 2014-04-15: qty 1

## 2014-04-15 MED ORDER — ONDANSETRON HCL 4 MG PO TABS: 4.0000 mg | ORAL_TABLET | ORAL | Status: DC | PRN

## 2014-04-15 MED ORDER — PNEUMOCOCCAL VAC POLYVALENT 25 MCG/0.5ML IJ INJ
0.5000 mL | INJECTION | INTRAMUSCULAR | Status: AC
  Administered 2014-04-16: 0.5 mL via INTRAMUSCULAR
  Filled 2014-04-15: qty 0.5

## 2014-04-15 MED ORDER — SIMETHICONE 80 MG PO CHEW: 80.0000 mg | CHEWABLE_TABLET | ORAL | Status: DC | PRN

## 2014-04-15 NOTE — Progress Notes (Signed)
Post Partum Day 1 Subjective: no complaints and tolerating PO  Objective: Blood pressure 120/74, pulse 65, temperature 97.9 F (36.6 C), temperature source Oral, resp. rate 18, height 5\' 3"  (1.6 m), weight 86.637 kg (191 lb), last menstrual period 07/28/2013, SpO2 99 %, unknown if currently breastfeeding.  Physical Exam:  General: alert and cooperative Lochia: appropriate Uterine Fundus: firm   Recent Labs  04/14/14 1540 04/15/14 0605  HGB 12.0 10.2*  HCT 36.8 30.7*    Assessment/Plan: Plan for discharge tomorrow   LOS: 1 day   Dillin Lofgren W 04/15/2014, 11:22 AM

## 2014-04-15 NOTE — Plan of Care (Signed)
Problem: Discharge Progression Outcomes Goal: Elimination assessed prior to transfer Outcome: Completed/Met Date Met:  04/15/14 Patient unable to void. Upon palpation, non-distended. Bladder scan result of 19ml. No need for I&O catheter.

## 2014-04-15 NOTE — Progress Notes (Signed)
UR chart review completed.  

## 2014-04-15 NOTE — Anesthesia Postprocedure Evaluation (Signed)
  Anesthesia Post-op Note  Patient: Erin Leonard  Procedure(s) Performed: * No procedures listed *  Patient Location: Mother/Baby  Anesthesia Type:Epidural  Level of Consciousness: awake, alert  and oriented  Airway and Oxygen Therapy: Patient Spontanous Breathing  Post-op Pain: none  Post-op Assessment: Post-op Vital signs reviewed and Patient's Cardiovascular Status Stable  Post-op Vital Signs: Reviewed and stable  Last Vitals:  Filed Vitals:   04/15/14 0505  BP: 114/67  Pulse: 64  Temp: 36.7 C  Resp: 20    Complications: No apparent anesthesia complications

## 2014-04-15 NOTE — Lactation Note (Signed)
This note was copied from the chart of Erin Leonard Mancias. Lactation Consultation Note Chose to bottle feed baby. Stated she BF 2 of her children for 1 month and she wasn't fond of it. Discussed Engorgement care. Patient Name: Erin Leonard Furgason [id number]'s Date: 04/15/2014     Maternal Data    Feeding Feeding Type: Bottle Fed - Formula  LATCH Score/Interventions                      Lactation Tools Discussed/Used     Consult Status      Charyl DancerCARVER, Shavonna Corella G 04/15/2014, 7:24 AM

## 2014-04-16 LAB — RPR: RPR: NONREACTIVE

## 2014-04-16 MED ORDER — IBUPROFEN 600 MG PO TABS
600.0000 mg | ORAL_TABLET | Freq: Four times a day (QID) | ORAL | Status: DC
Start: 2014-04-16 — End: 2018-01-30

## 2014-04-16 MED ORDER — OXYCODONE-ACETAMINOPHEN 5-325 MG PO TABS: 1.0000 | ORAL_TABLET | ORAL | Status: DC | PRN

## 2014-04-16 NOTE — Progress Notes (Signed)
Post Partum Day 2 Subjective: no complaints and tolerating PO  Objective: Blood pressure 116/74, pulse 70, temperature 98 F (36.7 C), temperature source Oral, resp. rate 18, height 5\' 3"  (1.6 m), weight 86.637 kg (191 lb), last menstrual period 07/28/2013, SpO2 99 %, unknown if currently breastfeeding.  Physical Exam:  General: alert and cooperative Lochia: appropriate Uterine Fundus: firm    Recent Labs  04/14/14 1540 04/15/14 0605  HGB 12.0 10.2*  HCT 36.8 30.7*    Assessment/Plan: Discharge home   LOS: 2 days   Shaniqua Guillot W 04/16/2014, 8:42 AM

## 2014-04-16 NOTE — Discharge Summary (Signed)
Obstetric Discharge Summary Reason for Admission: onset of labor Prenatal Procedures: none Intrapartum Procedures: spontaneous vaginal delivery Postpartum Procedures: none Complications-Operative and Postpartum: none HEMOGLOBIN  Date Value Ref Range Status  04/15/2014 10.2* 12.0 - 15.0 g/dL Final   HCT  Date Value Ref Range Status  04/15/2014 30.7* 36.0 - 46.0 % Final    Physical Exam:  General: alert and cooperative Lochia: appropriate Uterine Fundus: firm  Discharge Diagnoses: Term Pregnancy-delivered  Discharge Information: Date: 04/16/2014 Activity: pelvic rest Diet: routine Medications: Ibuprofen and Percocet Condition: improved Instructions: refer to practice specific booklet Discharge to: home Follow-up Information    Follow up with Oliver PilaICHARDSON,Alexya Mcdaris W, MD In 6 weeks.   Specialty:  Obstetrics and Gynecology   Why:  postpartum   Contact information:   [address] [place] [address] [phone]       Newborn Data: Live born female  Birth Weight: 6 lb 10 oz (3005 g) APGAR: 8, 9  Home with mother.  Oliver PilaRICHARDSON,Kimberely Mccannon W 04/16/2014, 8:43 AM

## 2018-01-30 ENCOUNTER — Emergency Department (HOSPITAL_BASED_OUTPATIENT_CLINIC_OR_DEPARTMENT_OTHER)
Admission: EM | Admit: 2018-01-30 | Discharge: 2018-01-30 | Disposition: A | Discharge status: Home / Self Care | Payer: Medicaid Other | Attending: Emergency Medicine | Admitting: Emergency Medicine

## 2018-01-30 ENCOUNTER — Emergency Department (HOSPITAL_BASED_OUTPATIENT_CLINIC_OR_DEPARTMENT_OTHER): Payer: Medicaid Other

## 2018-01-30 ENCOUNTER — Other Ambulatory Visit: Payer: Self-pay

## 2018-01-30 ENCOUNTER — Encounter (HOSPITAL_BASED_OUTPATIENT_CLINIC_OR_DEPARTMENT_OTHER): Payer: Self-pay | Admitting: Emergency Medicine

## 2018-01-30 DIAGNOSIS — R1011 Right upper quadrant pain: Secondary | ICD-10-CM

## 2018-01-30 DIAGNOSIS — Z79899 Other long term (current) drug therapy: Secondary | ICD-10-CM | POA: Diagnosis not present

## 2018-01-30 DIAGNOSIS — R1013 Epigastric pain: Secondary | ICD-10-CM | POA: Insufficient documentation

## 2018-01-30 DIAGNOSIS — Z87891 Personal history of nicotine dependence: Secondary | ICD-10-CM | POA: Insufficient documentation

## 2018-01-30 DIAGNOSIS — O9989 Other specified diseases and conditions complicating pregnancy, childbirth and the puerperium: Secondary | ICD-10-CM | POA: Insufficient documentation

## 2018-01-30 DIAGNOSIS — Z3A01 Less than 8 weeks gestation of pregnancy: Secondary | ICD-10-CM | POA: Insufficient documentation

## 2018-01-30 LAB — CBC WITH DIFFERENTIAL/PLATELET
Abs Immature Granulocytes: 0.03 10*3/uL (ref 0.00–0.07)
BASOS PCT: 0 %
Basophils Absolute: 0 10*3/uL (ref 0.0–0.1)
EOS ABS: 0.2 10*3/uL (ref 0.0–0.5)
Eosinophils Relative: 2 %
HCT: 40 % (ref 36.0–46.0)
Hemoglobin: 13 g/dL (ref 12.0–15.0)
Immature Granulocytes: 0 %
Lymphocytes Relative: 30 %
Lymphs Abs: 2.3 10*3/uL (ref 0.7–4.0)
MCH: 31.6 pg (ref 26.0–34.0)
MCHC: 32.5 g/dL (ref 30.0–36.0)
MCV: 97.1 fL (ref 80.0–100.0)
MONO ABS: 0.5 10*3/uL (ref 0.1–1.0)
Monocytes Relative: 6 %
NEUTROS ABS: 4.8 10*3/uL (ref 1.7–7.7)
Neutrophils Relative %: 62 %
Platelets: 241 10*3/uL (ref 150–400)
RBC: 4.12 MIL/uL (ref 3.87–5.11)
RDW: 14.7 % (ref 11.5–15.5)
WBC: 7.8 10*3/uL (ref 4.0–10.5)
nRBC: 0 % (ref 0.0–0.2)

## 2018-01-30 LAB — URINALYSIS, ROUTINE W REFLEX MICROSCOPIC
BILIRUBIN URINE: NEGATIVE
Glucose, UA: NEGATIVE mg/dL
Hgb urine dipstick: NEGATIVE
Ketones, ur: NEGATIVE mg/dL
NITRITE: NEGATIVE
Protein, ur: NEGATIVE mg/dL
SPECIFIC GRAVITY, URINE: 1.02 (ref 1.005–1.030)
pH: 6 (ref 5.0–8.0)

## 2018-01-30 LAB — COMPREHENSIVE METABOLIC PANEL
ALT: 15 U/L (ref 0–44)
ANION GAP: 6 (ref 5–15)
AST: 21 U/L (ref 15–41)
Albumin: 3.5 g/dL (ref 3.5–5.0)
Alkaline Phosphatase: 46 U/L (ref 38–126)
BUN: 9 mg/dL (ref 6–20)
CALCIUM: 8.8 mg/dL — AB (ref 8.9–10.3)
CHLORIDE: 106 mmol/L (ref 98–111)
CO2: 22 mmol/L (ref 22–32)
Creatinine, Ser: 0.71 mg/dL (ref 0.44–1.00)
GFR calc non Af Amer: >60 mL/min (ref >60)
Glucose, Bld: 98 mg/dL (ref 70–99)
Potassium: 3.7 mmol/L (ref 3.5–5.1)
Sodium: 134 mmol/L — ABNORMAL LOW (ref 135–145)
Total Bilirubin: 0.3 mg/dL (ref 0.3–1.2)
Total Protein: 6.9 g/dL (ref 6.5–8.1)

## 2018-01-30 LAB — HCG, QUANTITATIVE, PREGNANCY: hCG, Beta Chain, Quant, S: 8519 m[IU]/mL — ABNORMAL HIGH (ref <5)

## 2018-01-30 LAB — PREGNANCY, URINE: PREG TEST UR: POSITIVE — AB

## 2018-01-30 LAB — URINALYSIS, MICROSCOPIC (REFLEX)

## 2018-01-30 LAB — LIPASE, BLOOD: Lipase: 73 U/L — ABNORMAL HIGH (ref 11–51)

## 2018-01-30 MED ORDER — FAMOTIDINE 20 MG PO TABS: 20.0000 mg | ORAL_TABLET | Freq: Every day | ORAL | 0 refills | Status: DC

## 2018-01-30 MED ORDER — PRENATAL VITAMINS 28-0.8 MG PO TABS: 1.0000 | ORAL_TABLET | Freq: Every day | ORAL | 0 refills | Status: DC

## 2018-01-30 MED ORDER — ONDANSETRON 4 MG PO TBDP: 4.0000 mg | ORAL_TABLET | Freq: Three times a day (TID) | ORAL | 0 refills | Status: DC | PRN

## 2018-01-30 NOTE — ED Triage Notes (Signed)
Low abd pain with "a little diarrhea" x4-5 days.

## 2018-01-30 NOTE — ED Provider Notes (Signed)
[facility] EMERGENCY DEPARTMENT Provider Note   CSN: [id number] Arrival date & time: 01/30/18  9562     History   Chief Complaint Chief Complaint  Patient presents with  . Abdominal Pain    HPI Jenasis Straley is a 32 y.o. female.  Pt presents to the ED today with abdominal pain.  Pain has been going on for 4-5 days.  Severe pain comes and goes.  No n/v or fever.  A little bit of diarrhea.  She has not taken anything for her sx at home.  LMP 11/11, but she has irregular periods.     Past Medical History:  Diagnosis Date  . Chronic kidney disease    kidney stone in 2015    Patient Active Problem List   Diagnosis Date Noted  . NSVD (normal spontaneous vaginal delivery) 04/15/2014  . Active labor at term 04/14/2014    Past Surgical History:  Procedure Laterality Date  . BREAST SURGERY    . WISDOM TOOTH EXTRACTION       OB History    Gravida  5   Para  4   Term  4   Preterm  0   AB  0   Living  4     SAB  0   TAB  0   Ectopic  0   Multiple  0   Live Births  4            Home Medications    Prior to Admission medications   Medication Sig Start Date End Date Taking? Authorizing Provider  famotidine (PEPCID) 20 MG tablet Take 1 tablet (20 mg total) by mouth daily. 01/30/18   Jacalyn Lefevre, MD  ondansetron (ZOFRAN ODT) 4 MG disintegrating tablet Take 1 tablet (4 mg total) by mouth every 8 (eight) hours as needed. 01/30/18   Jacalyn Lefevre, MD  Prenatal Vit-Fe Fumarate-FA (PRENATAL VITAMINS) 28-0.8 MG TABS Take 1 tablet by mouth daily. 01/30/18   Jacalyn Lefevre, MD    Family History No family history on file.  Social History Social History   Tobacco Use  . Smoking status: Former Smoker    Types: Cigarettes    Last attempt to quit: 10/12/2013    Years since quitting: 4.3  . Smokeless tobacco: Never Used  Substance Use Topics  . Alcohol use: No  . Drug use: No     Allergies   Penicillins and Vancomycin   Review  of Systems Review of Systems  Gastrointestinal: Positive for abdominal pain.  All other systems reviewed and are negative.    Physical Exam Updated Vital Signs BP 110/74 (BP Location: Left Arm)   Pulse (!) 58   Temp 98.5 F (36.9 C) (Oral)   Resp 16   Ht 5\' 3"  (1.6 m)   Wt 68 kg   LMP 12/26/2017 (Exact Date)   SpO2 100%   BMI 26.57 kg/m   Physical Exam Vitals signs and nursing note reviewed.  Constitutional:      Appearance: She is well-developed and normal weight.  HENT:     Head: Normocephalic and atraumatic.     Mouth/Throat:     Mouth: Mucous membranes are moist.     Pharynx: Oropharynx is clear.  Eyes:     Extraocular Movements: Extraocular movements intact.     Pupils: Pupils are equal, round, and reactive to light.  Cardiovascular:     Rate and Rhythm: Normal rate and regular rhythm.  Pulmonary:     Effort: Pulmonary effort  is normal.     Breath sounds: Normal breath sounds.  Abdominal:     General: Abdomen is flat. Bowel sounds are normal.     Palpations: Abdomen is soft.     Tenderness: There is abdominal tenderness in the epigastric area.  Skin:    General: Skin is warm and dry.     Capillary Refill: Capillary refill takes less than 2 seconds.  Neurological:     General: No focal deficit present.     Mental Status: She is alert.  Psychiatric:        Mood and Affect: Mood normal.        Behavior: Behavior normal.      ED Treatments / Results  Labs (all labs ordered are listed, but only abnormal results are displayed) Labs Reviewed  COMPREHENSIVE METABOLIC PANEL - Abnormal; Notable for the following components:      Result Value   Sodium 134 (*)    Calcium 8.8 (*)    All other components within normal limits  LIPASE, BLOOD - Abnormal; Notable for the following components:   Lipase 73 (*)    All other components within normal limits  URINALYSIS, ROUTINE W REFLEX MICROSCOPIC - Abnormal; Notable for the following components:   APPearance HAZY  (*)    Leukocytes, UA TRACE (*)    All other components within normal limits  PREGNANCY, URINE - Abnormal; Notable for the following components:   Preg Test, Ur POSITIVE (*)    All other components within normal limits  HCG, QUANTITATIVE, PREGNANCY - Abnormal; Notable for the following components:   hCG, Beta Chain, Quant, S 8,519 (*)    All other components within normal limits  URINALYSIS, MICROSCOPIC (REFLEX) - Abnormal; Notable for the following components:   Bacteria, UA MANY (*)    All other components within normal limits  CBC WITH DIFFERENTIAL/PLATELET    EKG None  Radiology [place] Ob Comp < 14 Wks  Result Date: 01/30/2018 CLINICAL DATA:  Upper, mid and lower abdominal pain for the past 4-5 days. Five weeks and 0 days pregnant with a quantitative beta HCG of 8,519. EXAM: OBSTETRIC <14 WK US AND TRANSVAGINAL OB US TECHNIQUE: Both transabdominal and transvaginal ultrasound examinations were performed for complete evaluation of the gestation as well as the maternal uterus, adnexal regions, and pelvic cul-de-sac. Transvaginal technique was performed to assess early pregnancy. COMPARISON:  None. FINDINGS: Intrauterine gestational sac: Visualized Yolk sac:  Visualized Embryo:  Not visualized MSD: 8.2 mm   5 w   4 d Subchorionic hemorrhage:  Small Maternal uterus/adnexae: Normal appearing ovaries with a left corpus luteum noted. Small amount of free peritoneal fluid with low-level internal echoes. IMPRESSION: 1. Intrauterine gestational sac and yolk sac with an estimated gestational age of [redacted] weeks and 4 days. 2. No fetal pole visible at this time. This can be normal at this stage of gestation. Follow-up serial quantitative beta HCG determinations is recommended as well as a follow-up obstetrical ultrasound in 2 weeks. 3. Small amount of free peritoneal fluid with low-level internal echoes. The internal echoes could be due to hemorrhage or proteinaceous material. Electronically Signed   By: Beckie SaltsSteven   Reid M.D.   On: 01/30/2018 15:04   [place] Ob Transvaginal  Result Date: 01/30/2018 CLINICAL DATA:  Upper, mid and lower abdominal pain for the past 4-5 days. Five weeks and 0 days pregnant with a quantitative beta HCG of 8,519. EXAM: OBSTETRIC <14 WK US AND TRANSVAGINAL OB US TECHNIQUE: Both transabdominal and transvaginal  ultrasound examinations were performed for complete evaluation of the gestation as well as the maternal uterus, adnexal regions, and pelvic cul-de-sac. Transvaginal technique was performed to assess early pregnancy. COMPARISON:  None. FINDINGS: Intrauterine gestational sac: Visualized Yolk sac:  Visualized Embryo:  Not visualized MSD: 8.2 mm   5 w   4 d Subchorionic hemorrhage:  Small Maternal uterus/adnexae: Normal appearing ovaries with a left corpus luteum noted. Small amount of free peritoneal fluid with low-level internal echoes. IMPRESSION: 1. Intrauterine gestational sac and yolk sac with an estimated gestational age of [redacted] weeks and 4 days. 2. No fetal pole visible at this time. This can be normal at this stage of gestation. Follow-up serial quantitative beta HCG determinations is recommended as well as a follow-up obstetrical ultrasound in 2 weeks. 3. Small amount of free peritoneal fluid with low-level internal echoes. The internal echoes could be due to hemorrhage or proteinaceous material. Electronically Signed   By: Beckie Salts M.D.   On: 01/30/2018 15:04   US Abdomen Limited Ruq  Result Date: 01/30/2018 CLINICAL DATA:  Right upper quadrant pain, positive pregnancy EXAM: ULTRASOUND ABDOMEN LIMITED RIGHT UPPER QUADRANT COMPARISON:  None. FINDINGS: Gallbladder: No gallstones or wall thickening visualized. No sonographic Murphy sign noted by sonographer. Common bile duct: Diameter: 3.5 mm Liver: No focal lesion identified. Within normal limits in parenchymal echogenicity. Portal vein is patent on color Doppler imaging with normal direction of blood flow towards the liver.  IMPRESSION: No acute abnormality noted. Electronically Signed   By: Alcide Clever M.D.   On: 01/30/2018 14:57    Procedures Procedures (including critical care time)  Medications Ordered in ED Medications - No data to display   Initial Impression / Assessment and Plan / ED Course  I have reviewed the triage vital signs and the nursing notes.  Pertinent labs & imaging results that were available during my care of the patient were reviewed by me and considered in my medical decision making (see chart for details).    Pt has not had any pain while here.  She has not had any vaginal bleeding.  The pt has normal labs and a normal RUQ [place].  Pt did not know she was pregnant, but is 5 weeks and 4 days.  She will need to f/u with obgyn.  She is given a rx for pnv, zofran, and pepcid.  Return if worse.  Final Clinical Impressions(s) / ED Diagnoses   Final diagnoses:  RUQ pain  Epigastric abdominal pain  Less than [redacted] weeks gestation of pregnancy    ED Discharge Orders         Ordered    Prenatal Vit-Fe Fumarate-FA (PRENATAL VITAMINS) 28-0.8 MG TABS  Daily     01/30/18 1514    ondansetron (ZOFRAN ODT) 4 MG disintegrating tablet  Every 8 hours PRN     01/30/18 1514    famotidine (PEPCID) 20 MG tablet  Daily     01/30/18 1515           Jacalyn Lefevre, MD 01/30/18 1518

## 2018-02-14 ENCOUNTER — Encounter (HOSPITAL_BASED_OUTPATIENT_CLINIC_OR_DEPARTMENT_OTHER): Payer: Self-pay

## 2018-02-14 ENCOUNTER — Emergency Department (HOSPITAL_BASED_OUTPATIENT_CLINIC_OR_DEPARTMENT_OTHER)
Admission: EM | Admit: 2018-02-14 | Discharge: 2018-02-14 | Disposition: A | Discharge status: Home / Self Care | Payer: Medicaid Other | Attending: Emergency Medicine | Admitting: Emergency Medicine

## 2018-02-14 ENCOUNTER — Other Ambulatory Visit: Payer: Self-pay

## 2018-02-14 DIAGNOSIS — N898 Other specified noninflammatory disorders of vagina: Secondary | ICD-10-CM | POA: Diagnosis present

## 2018-02-14 DIAGNOSIS — B9689 Other specified bacterial agents as the cause of diseases classified elsewhere: Secondary | ICD-10-CM

## 2018-02-14 DIAGNOSIS — Z79899 Other long term (current) drug therapy: Secondary | ICD-10-CM | POA: Diagnosis not present

## 2018-02-14 DIAGNOSIS — N189 Chronic kidney disease, unspecified: Secondary | ICD-10-CM | POA: Diagnosis not present

## 2018-02-14 DIAGNOSIS — N760 Acute vaginitis: Secondary | ICD-10-CM | POA: Diagnosis not present

## 2018-02-14 DIAGNOSIS — Z87891 Personal history of nicotine dependence: Secondary | ICD-10-CM | POA: Diagnosis not present

## 2018-02-14 LAB — WET PREP, GENITAL
Sperm: NONE SEEN
Trich, Wet Prep: NONE SEEN
Yeast Wet Prep HPF POC: NONE SEEN

## 2018-02-14 LAB — URINALYSIS, MICROSCOPIC (REFLEX)

## 2018-02-14 LAB — URINALYSIS, ROUTINE W REFLEX MICROSCOPIC
Bilirubin Urine: NEGATIVE
GLUCOSE, UA: NEGATIVE mg/dL
Hgb urine dipstick: NEGATIVE
Ketones, ur: NEGATIVE mg/dL
Nitrite: NEGATIVE
PROTEIN: NEGATIVE mg/dL
Specific Gravity, Urine: 1.025 (ref 1.005–1.030)
pH: 6 (ref 5.0–8.0)

## 2018-02-14 MED ORDER — METRONIDAZOLE 500 MG PO TABS: 500.0000 mg | ORAL_TABLET | Freq: Two times a day (BID) | ORAL | 0 refills | Status: DC

## 2018-02-14 NOTE — ED Notes (Signed)
ED Provider at bedside. 

## 2018-02-14 NOTE — Discharge Instructions (Signed)
Take vitamin B6 and Unisom as discussed for nausea. Take Flagyl as prescribed and complete the full course.

## 2018-02-14 NOTE — ED Triage Notes (Signed)
C/o vaginal d/c x 1 week-pt is [redacted] week pregnant-NAD-steady gait

## 2018-02-14 NOTE — ED Provider Notes (Signed)
[facility] EMERGENCY DEPARTMENT Provider Note   CSN: [id number] Arrival date & time: 02/14/18  1315     History   Chief Complaint Chief Complaint  Patient presents with  . Vaginal Discharge    HPI Erin Leonard is a 32 y.o. female.  32 year old female presents with complaint of vaginal odor with white discharge.  Patient states that she is approximately [redacted] weeks pregnant, G5, P4, reports feeling nauseous all day long, has not had anything to take for her nausea and vomiting.  Patient is awaiting Medicaid before establishing care with OB for this pregnancy.  Last menstrual period was December 26, 2017.  Denies abdominal pain, vaginal bleeding.  No other complaints or concerns.     Past Medical History:  Diagnosis Date  . Chronic kidney disease    kidney stone in 2015    Patient Active Problem List   Diagnosis Date Noted  . NSVD (normal spontaneous vaginal delivery) 04/15/2014  . Active labor at term 04/14/2014    Past Surgical History:  Procedure Laterality Date  . BREAST SURGERY    . WISDOM TOOTH EXTRACTION       OB History    Gravida  5   Para  4   Term  4   Preterm  0   AB  0   Living  4     SAB  0   TAB  0   Ectopic  0   Multiple  0   Live Births  4            Home Medications    Prior to Admission medications   Medication Sig Start Date End Date Taking? Authorizing Provider  famotidine (PEPCID) 20 MG tablet Take 1 tablet (20 mg total) by mouth daily. 01/30/18   Jacalyn LefevreHaviland, Julie, MD  metroNIDAZOLE (FLAGYL) 500 MG tablet Take 1 tablet (500 mg total) by mouth 2 (two) times daily. 02/14/18   Jeannie FendMurphy, Harlem Bula A, PA-C  ondansetron (ZOFRAN ODT) 4 MG disintegrating tablet Take 1 tablet (4 mg total) by mouth every 8 (eight) hours as needed. 01/30/18   Jacalyn LefevreHaviland, Julie, MD  Prenatal Vit-Fe Fumarate-FA (PRENATAL VITAMINS) 28-0.8 MG TABS Take 1 tablet by mouth daily. 01/30/18   Jacalyn LefevreHaviland, Julie, MD    Family History No family history  on file.  Social History Social History   Tobacco Use  . Smoking status: Former Smoker    Types: Cigarettes    Last attempt to quit: 10/12/2013    Years since quitting: 4.3  . Smokeless tobacco: Never Used  Substance Use Topics  . Alcohol use: No  . Drug use: No     Allergies   Penicillins and Vancomycin   Review of Systems Review of Systems  Constitutional: Negative for chills and fever.  Gastrointestinal: Positive for nausea and vomiting. Negative for abdominal pain.  Genitourinary: Positive for vaginal discharge. Negative for dysuria, frequency, pelvic pain, urgency, vaginal bleeding and vaginal pain.  Skin: Negative for rash and wound.  Allergic/Immunologic: Negative for immunocompromised state.  Neurological: Negative for dizziness and weakness.  Hematological: Negative for adenopathy.  All other systems reviewed and are negative.    Physical Exam Updated Vital Signs BP 123/61 (BP Location: Right Arm)   Pulse 65   Temp 98.8 F (37.1 C) (Oral)   Resp 18   Ht 5\' 3"  (1.6 m)   Wt 64.9 kg   LMP 12/26/2017 (Exact Date)   SpO2 100%   BMI 25.33 kg/m   Physical Exam Vitals  signs and nursing note reviewed. Exam conducted with a chaperone present.  Constitutional:      General: She is not in acute distress.    Appearance: She is well-developed. She is not diaphoretic.  HENT:     Head: Normocephalic and atraumatic.  Cardiovascular:     Rate and Rhythm: Normal rate and regular rhythm.     Pulses: Normal pulses.     Heart sounds: No murmur.  Pulmonary:     Effort: Pulmonary effort is normal.     Breath sounds: Normal breath sounds.  Abdominal:     General: Abdomen is flat.     Tenderness: There is no abdominal tenderness.  Genitourinary:    Vagina: Normal.     Cervix: No cervical motion tenderness, discharge, friability, erythema or cervical bleeding.     Uterus: Normal. Not enlarged and not tender.      Adnexa: Right adnexa normal and left adnexa normal.         Right: No tenderness.         Left: No tenderness.    Skin:    General: Skin is warm and dry.  Neurological:     Mental Status: She is alert and oriented to person, place, and time.  Psychiatric:        Behavior: Behavior normal.      ED Treatments / Results  Labs (all labs ordered are listed, but only abnormal results are displayed) Labs Reviewed  WET PREP, GENITAL - Abnormal; Notable for the following components:      Result Value   Clue Cells Wet Prep HPF POC PRESENT (*)    WBC, Wet Prep HPF POC MANY (*)    All other components within normal limits  URINALYSIS, ROUTINE W REFLEX MICROSCOPIC - Abnormal; Notable for the following components:   Leukocytes, UA TRACE (*)    All other components within normal limits  URINALYSIS, MICROSCOPIC (REFLEX) - Abnormal; Notable for the following components:   Bacteria, UA MANY (*)    All other components within normal limits  GC/CHLAMYDIA PROBE AMP ([place]) NOT AT [facility]    EKG None  Radiology No results found.  Procedures Procedures (including critical care time)  Medications Ordered in ED Medications - No data to display   Initial Impression / Assessment and Plan / ED Course  I have reviewed the triage vital signs and the nursing notes.  Pertinent labs & imaging results that were available during my care of the patient were reviewed by me and considered in my medical decision making (see chart for details).  Clinical Course as of Feb 14 1609  Tue Feb 14, 2018  3070 32 year old female presents with complaint of vaginal odor.  Patient states she is approximately [redacted] weeks pregnant also reports morning sickness.  Abdomen is soft and nontender, pelvic exam there is no significant vaginal discharge, there is no pelvic tenderness.  Urinalysis negative for ketones and protein.  Wet prep positive for clue cells.  Patient be treated for bacterial vaginosis with Flagyl.  Recommend vitamin B6 and Unisom for her morning sickness.   Patient should follow-up with her OB or women's outpatient clinic.  Return to ER for any concerning symptoms.   [LM]    Clinical Course User Index [LM] Jeannie Fend, PA-C   Final Clinical Impressions(s) / ED Diagnoses   Final diagnoses:  BV (bacterial vaginosis)    ED Discharge Orders         Ordered    metroNIDAZOLE (FLAGYL)  500 MG tablet  2 times daily     02/14/18 1609           Alden HippMurphy, Liyat Faulkenberry A, PA-C 02/14/18 1610    Gwyneth SproutPlunkett, Whitney, MD 02/16/18 2145

## 2018-02-15 NOTE — L&D Delivery Note (Signed)
OB/GYN Faculty Practice Delivery Note  Sena Hoopingarner is a 33 y.o. [id number] s/p NSVD at [redacted]w[redacted]d. She was admitted for spontaneous labor.   ROM: 1h 20m with clear fluid GBS Status: positive  Maximum Maternal Temperature: 98.8  Labor Progress: . After a short second stage, the patient spontaneously delivered a female infant.  Delivery Date/Time: 09/27/18, [phone] Delivery: Called to room and patient was complete and pushing. Head delivered LOA, restituting LOT. No nuchal cord present. Shoulder and body delivered in usual fashion. Infant with spontaneous cry, placed on mother's abdomen, dried and stimulated. Cord clamped x 2 after 1-minute delay, and cut by FOB. Cord blood drawn. Placenta delivered spontaneously via Delena Bali mechanism with gentle cord traction. Fundus firm with massage and Pitocin. Labia, perineum, vagina, and cervix inspected and found to be intact.   Placenta: intact Complications: none Lacerations: none EBL: 125 mL Analgesia: Epidural  Postpartum Planning [X]  message to sent to schedule follow-up  [X]  vaccines UTD  Infant: female  APGARs 54,9  [place], [place] Fellow, [facility], [facility] 09/27/2018, 10:29 AM

## 2018-02-17 LAB — GC/CHLAMYDIA PROBE AMP (~~LOC~~) NOT AT ARMC
Chlamydia: NEGATIVE
Neisseria Gonorrhea: NEGATIVE

## 2018-03-09 ENCOUNTER — Ambulatory Visit (INDEPENDENT_AMBULATORY_CARE_PROVIDER_SITE_OTHER): Payer: Medicaid Other | Admitting: Family Medicine

## 2018-03-09 ENCOUNTER — Other Ambulatory Visit (HOSPITAL_COMMUNITY)
Admission: RE | Admit: 2018-03-09 | Discharge: 2018-03-09 | Disposition: A | Discharge status: Home / Self Care | Payer: Medicaid Other | Source: Ambulatory Visit | Attending: Family Medicine | Admitting: Family Medicine

## 2018-03-09 ENCOUNTER — Encounter: Payer: Self-pay | Admitting: Family Medicine

## 2018-03-09 DIAGNOSIS — Z3480 Encounter for supervision of other normal pregnancy, unspecified trimester: Secondary | ICD-10-CM | POA: Diagnosis not present

## 2018-03-09 DIAGNOSIS — Z3481 Encounter for supervision of other normal pregnancy, first trimester: Secondary | ICD-10-CM

## 2018-03-09 LAB — POCT URINALYSIS DIPSTICK OB
Blood, UA: NEGATIVE
Glucose, UA: NEGATIVE
Leukocytes, UA: NEGATIVE
SPEC GRAV UA: 1.015 (ref 1.010–1.025)
pH, UA: 6.5 (ref 5.0–8.0)

## 2018-03-09 NOTE — Progress Notes (Signed)
Subjective:  Erin Leonard is a [id number] [redacted]w[redacted]d being seen today for her first obstetrical visit.  Her obstetrical history is significant for 4 normal vaginal deliveries. Unplanned pregnancy, but would like to keep pregnancy. Patient does intend to breast feed. Pregnancy history fully reviewed.  Patient reports nausea.  BP 105/62   Pulse 68   Wt 149 lb (67.6 kg)   LMP 12/26/2017 (Exact Date)   BMI 26.39 kg/m   HISTORY: OB History  Gravida Para Term Preterm AB Living  5 4 4  0 0 4  SAB TAB Ectopic Multiple Live Births  0 0 0 0 4    # Outcome Date GA Lbr Len/2nd Weight Sex Delivery Anes PTL Lv  5 Current           4 Term 04/14/14 [redacted]w[redacted]d 11:54 / 00:15 6 lb 10 oz (3.005 kg) M Vag-Spont EPI  LIV  3 Term 2013 [redacted]w[redacted]d   F Vag-Spont EPI  LIV  2 Term 2011 [redacted]w[redacted]d   F Vag-Spont EPI  LIV  1 Term 2005 [redacted]w[redacted]d   M Vag-Spont EPI  LIV    Past Medical History:  Diagnosis Date  . Chronic kidney disease    kidney stone in 2015    Past Surgical History:  Procedure Laterality Date  . BREAST SURGERY    . WISDOM TOOTH EXTRACTION      No family history on file.   Exam  BP 105/62   Pulse 68   Wt 149 lb (67.6 kg)   LMP 12/26/2017 (Exact Date)   BMI 26.39 kg/m   CONSTITUTIONAL: Well-developed, well-nourished female in no acute distress.  HENT:  Normocephalic, atraumatic, External right and left ear normal. Oropharynx is clear and moist EYES: Conjunctivae and EOM are normal. Pupils are equal, round, and reactive to light. No scleral icterus.  NECK: Normal range of motion, supple, no masses.  Normal thyroid.  CARDIOVASCULAR: Normal heart rate noted, regular rhythm RESPIRATORY: Clear to auscultation bilaterally. Effort and breath sounds normal, no problems with respiration noted. BREASTS: Symmetric in size. No masses, skin changes, nipple drainage, or lymphadenopathy. ABDOMEN: Soft, normal bowel sounds, no distention noted.  No tenderness, rebound or guarding.  PELVIC: Normal appearing  external genitalia; normal appearing vaginal mucosa and cervix. No abnormal discharge noted. Normal uterine size, no other palpable masses, no uterine or adnexal tenderness. MUSCULOSKELETAL: Normal range of motion. No tenderness.  No cyanosis, clubbing, or edema.  2+ distal pulses. SKIN: Skin is warm and dry. No rash noted. Not diaphoretic. No erythema. No pallor. NEUROLOGIC: Alert and oriented to person, place, and time. Normal reflexes, muscle tone coordination. No cranial nerve deficit noted. PSYCHIATRIC: Normal mood and affect. Normal behavior. Normal judgment and thought content.    Assessment:    Pregnancy: [id number] Patient Active Problem List   Diagnosis Date Noted  . Supervision of other normal pregnancy, antepartum 03/09/2018  . NSVD (normal spontaneous vaginal delivery) 04/15/2014  . Active labor at term 04/14/2014      Plan:   1. Supervision of other normal pregnancy, antepartum Declines genetic screening.  Wants BTL postpartum. Discussed nature of practice - use of midwives, fellows. - [facility] AMB BABYSCRIPTS OPT IN - Culture, OB Urine - Obstetric Panel, Including HIV - SMN1 COPY NUMBER ANALYSIS (SMA Carrier Screen) - Cytology - PAP( [place]) - POC Urinalysis Dipstick OB - US MFM OB COMP + 14 WK; Future - Inher[employer] Society Guided     Problem list reviewed and updated. 75% of 45 min visit spent on  counseling and coordination of care.     Erin Leonard 03/09/2018

## 2018-03-09 NOTE — Progress Notes (Signed)
DATING AND VIABILITY SONOGRAM   Erin Leonard is a 33 y.o. year old G26P4004 with LMP Patient's last menstrual period was 12/26/2017 (exact date). which would correlate to  [redacted]w[redacted]d weeks gestation.  She has regular menstrual cycles.   She is here today for a confirmatory initial sonogram.    GESTATION: SINGLETON     FETAL ACTIVITY:          Heart rate      162          The fetus is active.  PLACENTA LOCALIZATION:  anterior     ADNEXA: The ovaries are normal.   GESTATIONAL AGE AND  BIOMETRICS:  Gestational criteria: Estimated Date of Delivery: 10/02/18 by LMP now at [redacted]w[redacted]d  Previous Scans:1      CROWN RUMP LENGTH           3.41 cm         10-2 weeks                                                                               AVERAGE EGA(BY THIS SCAN):  10-2 weeks  WORKING EDD( LMP ):  10/02/2018     TECHNICIAN COMMENTS: Patient informed that the ultrasound is considered a limited obstetric ultrasound and is not intended to be a complete ultrasound exam. Patient also informed that the ultrasound is not being completed with the intent of assessing for fetal or placental anomalies or any pelvic abnormalities. Explained that the purpose of today's ultrasound is to assess for fetal heart rate. Patient acknowledges the purpose of the exam and the limitations of the study.     Armandina Stammer 03/09/2018 9:50 AM

## 2018-03-13 ENCOUNTER — Telehealth: Payer: Self-pay

## 2018-03-13 ENCOUNTER — Other Ambulatory Visit: Payer: Self-pay

## 2018-03-13 ENCOUNTER — Encounter: Payer: Self-pay | Admitting: Family Medicine

## 2018-03-13 DIAGNOSIS — R8271 Bacteriuria: Secondary | ICD-10-CM | POA: Insufficient documentation

## 2018-03-13 LAB — CULTURE, OB URINE

## 2018-03-13 LAB — URINE CULTURE, OB REFLEX

## 2018-03-13 MED ORDER — CEPHALEXIN 500 MG PO CAPS: 500.0000 mg | ORAL_CAPSULE | Freq: Four times a day (QID) | ORAL | 2 refills | Status: DC

## 2018-03-13 NOTE — Telephone Encounter (Signed)
Patient called stating that she needs her antibiotic to go to [facility] in [place]. It looks like originally sent to [facility]- will cancel this order and resend to [facility]. Armandina Stammer RN

## 2018-03-13 NOTE — Addendum Note (Signed)
Addended by: [name] on: 03/13/2018 01:44 PM   Modules accepted: Orders

## 2018-03-13 NOTE — Telephone Encounter (Signed)
Called pt with results. Understanding was voiced. Erin Leonard, CMA

## 2018-03-13 NOTE — Telephone Encounter (Signed)
-----   Message from Levie HeritageJacob J Stinson, DO sent at 03/13/2018  1:45 PM EST ----- Patient has GBS bacteriuria - needs antibiotics. Keflex 500mg  4x daily prescribed. Please let pt know.

## 2018-03-13 NOTE — Telephone Encounter (Signed)
Pt called the office stating that she is allergic to penicillin and is concerned about taking keflex. Informed pt that we will discuss medication with the provider. Understanding was voiced.  Erin Leonard, CMA

## 2018-03-14 LAB — CYTOLOGY - PAP
Chlamydia: NEGATIVE
Diagnosis: NEGATIVE
HPV: NOT DETECTED
Neisseria Gonorrhea: NEGATIVE

## 2018-03-24 LAB — OBSTETRIC PANEL, INCLUDING HIV
Antibody Screen: NEGATIVE
Basophils Absolute: 0.1 10*3/uL (ref 0.0–0.2)
Basos: 1 %
EOS (ABSOLUTE): 0.1 10*3/uL (ref 0.0–0.4)
Eos: 1 %
HEMOGLOBIN: 13.1 g/dL (ref 11.1–15.9)
HIV Screen 4th Generation wRfx: NONREACTIVE
Hematocrit: 38 % (ref 34.0–46.6)
Hepatitis B Surface Ag: NEGATIVE
IMMATURE GRANS (ABS): 0 10*3/uL (ref 0.0–0.1)
IMMATURE GRANULOCYTES: 0 %
LYMPHS: 23 %
Lymphocytes Absolute: 2.4 10*3/uL (ref 0.7–3.1)
MCH: 32.2 pg (ref 26.6–33.0)
MCHC: 34.5 g/dL (ref 31.5–35.7)
MCV: 93 fL (ref 79–97)
Monocytes Absolute: 0.7 10*3/uL (ref 0.1–0.9)
Monocytes: 6 %
Neutrophils Absolute: 6.9 10*3/uL (ref 1.4–7.0)
Neutrophils: 69 %
Platelets: 264 10*3/uL (ref 150–450)
RBC: 4.07 x10E6/uL (ref 3.77–5.28)
RDW: 13.8 % (ref 11.7–15.4)
RPR Ser Ql: NONREACTIVE
Rh Factor: POSITIVE
Rubella Antibodies, IGG: 1.21 index (ref >0.99)
WBC: 10.1 10*3/uL (ref 3.4–10.8)

## 2018-03-24 LAB — INHERITEST SOCIETY GUIDED

## 2018-04-06 ENCOUNTER — Ambulatory Visit (INDEPENDENT_AMBULATORY_CARE_PROVIDER_SITE_OTHER): Payer: Medicaid Other | Admitting: Family Medicine

## 2018-04-06 VITALS — BP 112/61 | HR 70 | Wt 157.0 lb

## 2018-04-06 DIAGNOSIS — Z3A14 14 weeks gestation of pregnancy: Secondary | ICD-10-CM

## 2018-04-06 DIAGNOSIS — Z3480 Encounter for supervision of other normal pregnancy, unspecified trimester: Secondary | ICD-10-CM

## 2018-04-06 DIAGNOSIS — Z3482 Encounter for supervision of other normal pregnancy, second trimester: Secondary | ICD-10-CM

## 2018-04-06 NOTE — Progress Notes (Signed)
   PRENATAL VISIT NOTE  Subjective:  Erin Leonard is a 33 y.o. G5P4004 at [redacted]w[redacted]d being seen today for ongoing prenatal care.  She is currently monitored for the following issues for this low-risk pregnancy and has Active labor at term; NSVD (normal spontaneous vaginal delivery); Supervision of other normal pregnancy, antepartum; and GBS bacteriuria on their problem list.  Patient reports no complaints.  Contractions: Not present. Vag. Bleeding: None.   . Denies leaking of fluid.   The following portions of the patient's history were reviewed and updated as appropriate: allergies, current medications, past family history, past medical history, past social history, past surgical history and problem list. Problem list updated.  Objective:   Vitals:   04/06/18 1014  BP: 112/61  Pulse: 70  Weight: 157 lb (71.2 kg)    Fetal Status: Fetal Heart Rate (bpm): 155         General:  Alert, oriented and cooperative. Patient is in no acute distress.  Skin: Skin is warm and dry. No rash noted.   Cardiovascular: Normal heart rate noted  Respiratory: Normal respiratory effort, no problems with respiration noted  Abdomen: Soft, gravid, appropriate for gestational age.  Pain/Pressure: Absent     Pelvic: Cervical exam deferred        Extremities: Normal range of motion.  Edema: None  Mental Status: Normal mood and affect. Normal behavior. Normal judgment and thought content.   Assessment and Plan:  Pregnancy: G5P4004 at [redacted]w[redacted]d  1. Supervision of other normal pregnancy, antepartum FHT and FH normal  Preterm labor symptoms and general obstetric precautions including but not limited to vaginal bleeding, contractions, leaking of fluid and fetal movement were reviewed in detail with the patient. Please refer to After Visit Summary for other counseling recommendations.  Return in about 4 weeks (around 05/04/2018) for OB f/u.  Future Appointments  Date Time Provider Department Center  05/08/2018 10:15  AM [facility] [place] 4 WH-MFCUS [facility]-US    [name], DO

## 2018-05-01 ENCOUNTER — Encounter (HOSPITAL_COMMUNITY): Payer: Self-pay

## 2018-05-02 ENCOUNTER — Ambulatory Visit (INDEPENDENT_AMBULATORY_CARE_PROVIDER_SITE_OTHER): Payer: Medicaid Other | Admitting: Advanced Practice Midwife

## 2018-05-02 ENCOUNTER — Other Ambulatory Visit: Payer: Self-pay

## 2018-05-02 ENCOUNTER — Encounter: Payer: Self-pay | Admitting: Advanced Practice Midwife

## 2018-05-02 VITALS — BP 104/66 | HR 102 | Wt 160.1 lb

## 2018-05-02 DIAGNOSIS — Z3480 Encounter for supervision of other normal pregnancy, unspecified trimester: Secondary | ICD-10-CM

## 2018-05-02 DIAGNOSIS — N200 Calculus of kidney: Secondary | ICD-10-CM | POA: Insufficient documentation

## 2018-05-02 DIAGNOSIS — Z3A18 18 weeks gestation of pregnancy: Secondary | ICD-10-CM

## 2018-05-02 DIAGNOSIS — Z3482 Encounter for supervision of other normal pregnancy, second trimester: Secondary | ICD-10-CM

## 2018-05-02 DIAGNOSIS — N390 Urinary tract infection, site not specified: Secondary | ICD-10-CM | POA: Insufficient documentation

## 2018-05-02 NOTE — Progress Notes (Signed)
   PRENATAL VISIT NOTE  Subjective:  Erin Leonard is a 33 y.o. G5P4004 at [redacted]w[redacted]d being seen today for ongoing prenatal care.  She is currently monitored for the following issues for this low-risk pregnancy and has Active labor at term; NSVD (normal spontaneous vaginal delivery); Supervision of other normal pregnancy, antepartum; GBS bacteriuria; Calculus of kidney; Abnormal cervical Papanicolaou smear; and Recurrent urinary tract infection on their problem list.  Patient reports no complaints.  Contractions: Not present. Vag. Bleeding: None.  Movement: Present. Denies leaking of fluid.   The following portions of the patient's history were reviewed and updated as appropriate: allergies, current medications, past family history, past medical history, past social history, past surgical history and problem list.   Objective:   Vitals:   05/02/18 1021  BP: 104/66  Pulse: (!) 102  Weight: 72.6 kg    Fetal Status: Fetal Heart Rate (bpm): 158 Fundal Height: 20 cm Movement: Present     General:  Alert, oriented and cooperative. Patient is in no acute distress.  Skin: Skin is warm and dry. No rash noted.   Cardiovascular: Normal heart rate noted  Respiratory: Normal respiratory effort, no problems with respiration noted  Abdomen: Soft, gravid, appropriate for gestational age.  Pain/Pressure: Absent     Pelvic: Cervical exam deferred        Extremities: Normal range of motion.  Edema: None  Mental Status: Normal mood and affect. Normal behavior. Normal judgment and thought content.   Assessment and Plan:  Pregnancy: G5P4004 at [redacted]w[redacted]d 1. Supervision of other normal pregnancy, antepartum     Reviewed signs to report     Reviewed general precautions re: covid-19  Preterm labor symptoms and general obstetric precautions including but not limited to vaginal bleeding, contractions, leaking of fluid and fetal movement were reviewed in detail with the patient. Please refer to After Visit Summary  for other counseling recommendations.   Return in about 4 weeks (around 05/30/2018) for [facility].  Future Appointments  Date Time Provider Department Center  05/08/2018 10:15 AM [facility] [place] 4 WH-MFCUS M[facility]-US  05/30/2018 10:15 AM Aviva Signs, CNM [facility] None    Wynelle Bourgeois, CNM

## 2018-05-02 NOTE — Patient Instructions (Signed)

## 2018-05-08 ENCOUNTER — Other Ambulatory Visit: Payer: Self-pay

## 2018-05-08 ENCOUNTER — Ambulatory Visit (HOSPITAL_COMMUNITY)
Admission: RE | Admit: 2018-05-08 | Discharge: 2018-05-08 | Disposition: A | Discharge status: Home / Self Care | Payer: Medicaid Other | Source: Ambulatory Visit | Attending: Family Medicine | Admitting: Family Medicine

## 2018-05-08 ENCOUNTER — Other Ambulatory Visit (HOSPITAL_COMMUNITY): Payer: Self-pay | Admitting: *Deleted

## 2018-05-08 DIAGNOSIS — Z3480 Encounter for supervision of other normal pregnancy, unspecified trimester: Secondary | ICD-10-CM | POA: Insufficient documentation

## 2018-05-08 DIAGNOSIS — Z3A19 19 weeks gestation of pregnancy: Secondary | ICD-10-CM | POA: Diagnosis not present

## 2018-05-08 DIAGNOSIS — Z362 Encounter for other antenatal screening follow-up: Secondary | ICD-10-CM

## 2018-05-08 DIAGNOSIS — Z363 Encounter for antenatal screening for malformations: Secondary | ICD-10-CM | POA: Diagnosis not present

## 2018-05-30 ENCOUNTER — Encounter: Payer: Self-pay | Admitting: Advanced Practice Midwife

## 2018-05-30 ENCOUNTER — Ambulatory Visit (INDEPENDENT_AMBULATORY_CARE_PROVIDER_SITE_OTHER): Payer: Medicaid Other | Admitting: Advanced Practice Midwife

## 2018-05-30 DIAGNOSIS — Z3A22 22 weeks gestation of pregnancy: Secondary | ICD-10-CM | POA: Diagnosis not present

## 2018-05-30 DIAGNOSIS — Z3482 Encounter for supervision of other normal pregnancy, second trimester: Secondary | ICD-10-CM | POA: Diagnosis not present

## 2018-05-30 DIAGNOSIS — Z3480 Encounter for supervision of other normal pregnancy, unspecified trimester: Secondary | ICD-10-CM

## 2018-05-30 NOTE — Progress Notes (Signed)
   PRENATAL VISIT NOTE   TELEHEALTH VISIT  I connected with  Erin Leonard on 05/30/18 by a Telephone telemedicine application and verified that I am speaking with the correct person using two identifiers.   I discussed the limitations of evaluation and management by telemedicine. The patient expressed understanding and agreed to proceed  Patient was at home I was in office. Armandina Stammer RN initiated call Total time spent 6 minutes..   Subjective:  Erin Leonard is a 33 y.o. G5P4004 at [redacted]w[redacted]d being seen today for ongoing prenatal care.  She is currently monitored for the following issues for this low-risk pregnancy and has Active labor at term; NSVD (normal spontaneous vaginal delivery); Supervision of other normal pregnancy, antepartum; GBS bacteriuria; Calculus of kidney; Abnormal cervical Papanicolaou smear; and Recurrent urinary tract infection on their problem list.  Patient reports no complaints.  Contractions: Not present. Vag. Bleeding: None.  Movement: Present. Denies leaking of fluid.   The following portions of the patient's history were reviewed and updated as appropriate: allergies, current medications, past family history, past medical history, past social history, past surgical history and problem list.   Objective:  There were no vitals filed for this visit.  Fetal Status:     Movement: Present     General:  Alert, oriented and cooperative. Patient is in no acute distress.  Skin: Skin is warm and dry. No rash noted.   Cardiovascular: Normal heart rate noted  Respiratory: Normal respiratory effort, no problems with respiration noted  Abdomen: Soft, gravid, appropriate for gestational age.  Pain/Pressure: Absent     Pelvic: Cervical exam deferred        Extremities: Normal range of motion.  Edema: None  Mental Status: Normal mood and affect. Normal behavior. Normal judgment and thought content.   Assessment and Plan:  Pregnancy: G5P4004 at [redacted]w[redacted]d 1. Supervision  of other normal pregnancy, antepartum      Reviewed normal changes and developments for this stage of pregnancy       Discussed round ligament pain (has had a few) and went over signs to report.     Scheduled for followup anatomy US, reviewed new location.  Preterm labor symptoms and general obstetric precautions including but not limited to vaginal bleeding, contractions, leaking of fluid and fetal movement were reviewed in detail with the patient. Please refer to After Visit Summary for other counseling recommendations.   Return in about 4 weeks (around 06/27/2018) for [facility].  Future Appointments  Date Time Provider Department Center  06/05/2018 10:30 AM [facility] [place] 1 WH-MFCUS M[facility]-US  06/27/2018 10:15 AM Aviva Signs, CNM [facility] None    Erin Leonard, CNM

## 2018-05-30 NOTE — Patient Instructions (Signed)

## 2018-06-05 ENCOUNTER — Ambulatory Visit (HOSPITAL_COMMUNITY): Admission: RE | Admit: 2018-06-05 | Payer: Medicaid Other | Source: Ambulatory Visit

## 2018-06-05 ENCOUNTER — Encounter (HOSPITAL_COMMUNITY): Payer: Self-pay

## 2018-06-27 ENCOUNTER — Encounter: Payer: Self-pay | Admitting: Obstetrics & Gynecology

## 2018-06-27 ENCOUNTER — Ambulatory Visit (INDEPENDENT_AMBULATORY_CARE_PROVIDER_SITE_OTHER): Payer: Medicaid Other | Admitting: Obstetrics & Gynecology

## 2018-06-27 DIAGNOSIS — O2342 Unspecified infection of urinary tract in pregnancy, second trimester: Secondary | ICD-10-CM

## 2018-06-27 DIAGNOSIS — R8271 Bacteriuria: Secondary | ICD-10-CM

## 2018-06-27 DIAGNOSIS — Z3A26 26 weeks gestation of pregnancy: Secondary | ICD-10-CM | POA: Diagnosis not present

## 2018-06-27 DIAGNOSIS — N390 Urinary tract infection, site not specified: Secondary | ICD-10-CM

## 2018-06-27 DIAGNOSIS — Z3480 Encounter for supervision of other normal pregnancy, unspecified trimester: Secondary | ICD-10-CM

## 2018-06-27 NOTE — Progress Notes (Signed)
I connected with pt via WebEx on 06/27/18 at 10:15 AM EDT  and verified that I am speaking with the correct person using two identifiers.  Patient is located at home and provider is located at [facility].     The purpose of this virtual visit is to provide medical care while limiting exposure to the novel coronavirus. I discussed the limitations, risks, security and privacy concerns of performing an evaluation and management service by WebEx and the availability of in person appointments. I also discussed with the patient that there may be a patient responsible charge related to this service. By engaging in this virtual visit, you consent to the provision of healthcare.  Additionally, you authorize for your insurance to be billed for the services provided during this visit.  The patient expressed understanding and agreed to proceed.  The following staff members participated in the virtual visit:  Armandina Stammer    PRENATAL VISIT NOTE  Subjective:  Erin Leonard is a 33 y.o. G5P4004 at [redacted]w[redacted]d  for phone visit for ongoing prenatal care.  She is currently monitored for the following issues for this low-risk pregnancy and has Supervision of other normal pregnancy, antepartum; GBS bacteriuria; Calculus of kidney; Abnormal cervical Papanicolaou smear; and Recurrent urinary tract infection on their problem list.  Patient reports no complaints.  Contractions: Not present. Vag. Bleeding: None.  Movement: Present. Denies leaking of fluid.   The following portions of the patient's history were reviewed and updated as appropriate: allergies, current medications, past family history, past medical history, past social history, past surgical history and problem list.   Objective:  There were no vitals filed for this visit. Self-Obtained  Fetal Status:     Movement: Present     Assessment and Plan:  Pregnancy: G5P4004 at [redacted]w[redacted]d 1. Supervision of other normal pregnancy, antepartum No problem  desires BTL. Will  sign Title X[employer] papers at next visit.   Need f/u US to complete anatomy   2. Recurrent urinary tract infection No  UTI sx  3. GBS bacteriuria Need atbx in labor.    Preterm labor symptoms and general obstetric precautions including but not limited to vaginal bleeding, contractions, leaking of fluid and fetal movement were reviewed in detail with the patient.  No follow-ups on file.  No future appointments.   Time spent on virtual visit: 15 minutes  Willodean Rosenthal, MD

## 2018-06-27 NOTE — Progress Notes (Signed)
Patient verified with 2 patient identifiers and agrees to [facility] visit. Armandina Stammer Rn

## 2018-07-05 ENCOUNTER — Ambulatory Visit (HOSPITAL_COMMUNITY)
Admission: RE | Admit: 2018-07-05 | Discharge: 2018-07-05 | Disposition: A | Discharge status: Home / Self Care | Payer: Medicaid Other | Source: Ambulatory Visit | Attending: Obstetrics and Gynecology | Admitting: Obstetrics and Gynecology

## 2018-07-05 ENCOUNTER — Other Ambulatory Visit: Payer: Self-pay

## 2018-07-05 DIAGNOSIS — Z3A27 27 weeks gestation of pregnancy: Secondary | ICD-10-CM

## 2018-07-05 DIAGNOSIS — Z362 Encounter for other antenatal screening follow-up: Secondary | ICD-10-CM | POA: Diagnosis not present

## 2018-07-18 ENCOUNTER — Encounter: Payer: Medicaid Other | Admitting: Advanced Practice Midwife

## 2018-07-21 ENCOUNTER — Ambulatory Visit (INDEPENDENT_AMBULATORY_CARE_PROVIDER_SITE_OTHER): Payer: Medicaid Other | Admitting: Family Medicine

## 2018-07-21 ENCOUNTER — Other Ambulatory Visit: Payer: Self-pay

## 2018-07-21 ENCOUNTER — Other Ambulatory Visit: Payer: Medicaid Other

## 2018-07-21 VITALS — BP 107/64 | HR 94 | Wt 174.0 lb

## 2018-07-21 DIAGNOSIS — Z3A29 29 weeks gestation of pregnancy: Secondary | ICD-10-CM

## 2018-07-21 DIAGNOSIS — Z3480 Encounter for supervision of other normal pregnancy, unspecified trimester: Secondary | ICD-10-CM

## 2018-07-21 DIAGNOSIS — O23593 Infection of other part of genital tract in pregnancy, third trimester: Secondary | ICD-10-CM

## 2018-07-21 DIAGNOSIS — R8271 Bacteriuria: Secondary | ICD-10-CM

## 2018-07-21 NOTE — Progress Notes (Signed)
   PRENATAL VISIT NOTE  Subjective:  Erin Leonard is a 33 y.o. G5P4004 at [redacted]w[redacted]d being seen today for ongoing prenatal care.  She is currently monitored for the following issues for this low-risk pregnancy and has Supervision of other normal pregnancy, antepartum; GBS bacteriuria; Calculus of kidney; Abnormal cervical Papanicolaou smear; and Recurrent urinary tract infection on their problem list.  Patient reports no complaints.  Contractions: Irritability. Vag. Bleeding: None.  Movement: Present. Denies leaking of fluid.   The following portions of the patient's history were reviewed and updated as appropriate: allergies, current medications, past family history, past medical history, past social history, past surgical history and problem list.   Objective:   Vitals:   07/21/18 1007  BP: 107/64  Pulse: 94  Weight: 174 lb (78.9 kg)    Fetal Status: Fetal Heart Rate (bpm): 165 Fundal Height: 30 cm Movement: Present  Presentation: Vertex  General:  Alert, oriented and cooperative. Patient is in no acute distress.  Skin: Skin is warm and dry. No rash noted.   Cardiovascular: Normal heart rate noted  Respiratory: Normal respiratory effort, no problems with respiration noted  Abdomen: Soft, gravid, appropriate for gestational age.  Pain/Pressure: Present     Pelvic: Cervical exam deferred        Extremities: Normal range of motion.  Edema: None  Mental Status: Normal mood and affect. Normal behavior. Normal judgment and thought content.   Assessment and Plan:  Pregnancy: G5P4004 at [redacted]w[redacted]d  1. Supervision of other normal pregnancy, antepartum FHT and FH normal  2. GBS bacteriuria Intrapartum PPx   Preterm labor symptoms and general obstetric precautions including but not limited to vaginal bleeding, contractions, leaking of fluid and fetal movement were reviewed in detail with the patient. Please refer to After Visit Summary for other counseling recommendations.   No follow-ups  on file.  No future appointments.  [name], DO

## 2018-07-28 ENCOUNTER — Other Ambulatory Visit: Payer: Medicaid Other

## 2018-08-22 ENCOUNTER — Encounter: Payer: Self-pay | Admitting: Advanced Practice Midwife

## 2018-08-22 ENCOUNTER — Ambulatory Visit (INDEPENDENT_AMBULATORY_CARE_PROVIDER_SITE_OTHER): Payer: Medicaid Other | Admitting: Advanced Practice Midwife

## 2018-08-22 ENCOUNTER — Other Ambulatory Visit: Payer: Self-pay

## 2018-08-22 VITALS — BP 120/76 | HR 92 | Wt 179.8 lb

## 2018-08-22 DIAGNOSIS — Z3A34 34 weeks gestation of pregnancy: Secondary | ICD-10-CM

## 2018-08-22 DIAGNOSIS — Z3483 Encounter for supervision of other normal pregnancy, third trimester: Secondary | ICD-10-CM

## 2018-08-22 DIAGNOSIS — Z3480 Encounter for supervision of other normal pregnancy, unspecified trimester: Secondary | ICD-10-CM

## 2018-08-22 NOTE — Progress Notes (Signed)
   PRENATAL VISIT NOTE  Subjective:  Erin Leonard is a 33 y.o. K7Q2595 at [redacted]w[redacted]d being seen today for ongoing prenatal care.  She is currently monitored for the following issues for this low-risk pregnancy and has Supervision of other normal pregnancy, antepartum; GBS bacteriuria; Calculus of kidney; and Recurrent urinary tract infection on their problem list.  Patient reports no complaints.  Contractions: Not present.  .  Movement: Present. Denies leaking of fluid.   The following portions of the patient's history were reviewed and updated as appropriate: allergies, current medications, past family history, past medical history, past social history, past surgical history and problem list.   Objective:   Vitals:   08/22/18 0912  BP: 120/76  Pulse: 92  Weight: 81.6 kg    Fetal Status: Fetal Heart Rate (bpm): 145 Fundal Height: 34 cm Movement: Present     General:  Alert, oriented and cooperative. Patient is in no acute distress.  Skin: Skin is warm and dry. No rash noted.   Cardiovascular: Normal heart rate noted  Respiratory: Normal respiratory effort, no problems with respiration noted  Abdomen: Soft, gravid, appropriate for gestational age.  Pain/Pressure: Present     Pelvic: Cervical exam deferred        Extremities: Normal range of motion.  Edema: None  Mental Status: Normal mood and affect. Normal behavior. Normal judgment and thought content.   Assessment and Plan:  Pregnancy: G3O7564 at [redacted]w[redacted]d . Preterm labor symptoms and general obstetric precautions including but not limited to vaginal bleeding, contractions, leaking of fluid and fetal movement were reviewed in detail with the patient. Please refer to After Visit Summary for other counseling recommendations.   Return in about 2 weeks (around 09/05/2018) for [facility].  Future Appointments  Date Time Provider [place]  09/07/2018  9:00 AM Truett Mainland, DO [facility] None    Hansel Feinstein, CNM

## 2018-08-22 NOTE — Progress Notes (Deleted)
  Subjective:    Erin Leonard is being seen today for her first obstetrical visit.  This {is/is not:9024} a planned pregnancy. She is at [redacted]w[redacted]d gestation. Her obstetrical history is significant for {ob risk factors:10154}. Relationship with FOB: {fob:16621}. Patient {does/does not:19097} intend to breast feed. Pregnancy history fully reviewed.  Patient reports {sx:14538}.  Review of Systems:   Review of Systems  Objective:     BP 120/76   Pulse 92   Wt 81.6 kg   LMP 12/26/2017 (Exact Date)   BMI 31.85 kg/m  Physical Exam  Exam    Assessment:    Pregnancy: G5P4004 Patient Active Problem List   Diagnosis Date Noted  . Calculus of kidney 05/02/2018  . Recurrent urinary tract infection 05/02/2018  . GBS bacteriuria 03/13/2018  . Supervision of other normal pregnancy, antepartum 03/09/2018       Plan:     Initial labs drawn. Prenatal vitamins. Problem list reviewed and updated. AFP3 discussed: {requests/ordered/declines:14581}. Role of ultrasound in pregnancy discussed; fetal survey: {requests/ordered/declines:14581}. Amniocentesis discussed: {amniocentesis:14582}. Follow up in {numbers 0-4:31231} weeks. ***% of *** min visit spent on counseling and coordination of care.  ***   Mekenzie Modeste 08/22/2018   

## 2018-08-22 NOTE — Progress Notes (Signed)
Babyscripts  

## 2018-08-22 NOTE — Progress Notes (Deleted)
  Subjective:    Erin Leonard is being seen today for her first obstetrical visit.  This {is/is not:9024} a planned pregnancy. She is at [redacted]w[redacted]d gestation. Her obstetrical history is significant for {ob risk factors:10154}. Relationship with FOB: {fob:16621}. Patient {does/does not:19097} intend to breast feed. Pregnancy history fully reviewed.  Patient reports {sx:14538}.  Review of Systems:   Review of Systems  Objective:     BP 120/76   Pulse 92   Wt 81.6 kg   LMP 12/26/2017 (Exact Date)   BMI 31.85 kg/m  Physical Exam  Exam    Assessment:    Pregnancy: M5Y6503 Patient Active Problem List   Diagnosis Date Noted  . Calculus of kidney 05/02/2018  . Recurrent urinary tract infection 05/02/2018  . GBS bacteriuria 03/13/2018  . Supervision of other normal pregnancy, antepartum 03/09/2018       Plan:     Initial labs drawn. Prenatal vitamins. Problem list reviewed and updated. AFP3 discussed: {requests/ordered/declines:14581}. Role of ultrasound in pregnancy discussed; fetal survey: {requests/ordered/declines:14581}. Amniocentesis discussed: {amniocentesis:14582}. Follow up in {numbers 0-4:31231} weeks. ***% of *** min visit spent on counseling and coordination of care.  ***   Hansel Feinstein 08/22/2018

## 2018-08-22 NOTE — Patient Instructions (Signed)

## 2018-09-07 ENCOUNTER — Encounter: Payer: Medicaid Other | Admitting: Family Medicine

## 2018-09-12 ENCOUNTER — Ambulatory Visit (INDEPENDENT_AMBULATORY_CARE_PROVIDER_SITE_OTHER): Payer: Medicaid Other | Admitting: Advanced Practice Midwife

## 2018-09-12 ENCOUNTER — Encounter: Payer: Self-pay | Admitting: Advanced Practice Midwife

## 2018-09-12 ENCOUNTER — Other Ambulatory Visit: Payer: Self-pay

## 2018-09-12 ENCOUNTER — Other Ambulatory Visit (HOSPITAL_COMMUNITY)
Admission: RE | Admit: 2018-09-12 | Discharge: 2018-09-12 | Disposition: A | Discharge status: Home / Self Care | Payer: Medicaid Other | Source: Ambulatory Visit | Attending: Family Medicine | Admitting: Family Medicine

## 2018-09-12 VITALS — BP 120/71 | HR 87 | Wt 182.0 lb

## 2018-09-12 DIAGNOSIS — Z3480 Encounter for supervision of other normal pregnancy, unspecified trimester: Secondary | ICD-10-CM | POA: Diagnosis present

## 2018-09-12 DIAGNOSIS — Z3483 Encounter for supervision of other normal pregnancy, third trimester: Secondary | ICD-10-CM

## 2018-09-12 DIAGNOSIS — Z3A37 37 weeks gestation of pregnancy: Secondary | ICD-10-CM

## 2018-09-12 NOTE — Patient Instructions (Signed)

## 2018-09-12 NOTE — Progress Notes (Deleted)
  Subjective:    Erin Leonard is being seen today for her first obstetrical visit.  This {is/is not:9024} a planned pregnancy. She is at [redacted]w[redacted]d gestation. Her obstetrical history is significant for {ob risk factors:10154}. Relationship with FOB: {fob:16621}. Patient {does/does not:19097} intend to breast feed. Pregnancy history fully reviewed.  Patient reports {sx:14538}.  Review of Systems:   Review of Systems  Objective:     BP 120/71   Pulse 87   Wt 82.6 kg   LMP 12/26/2017 (Exact Date)   BMI 32.24 kg/m  Physical Exam  Exam    Assessment:    Pregnancy: P1W2585 Patient Active Problem List   Diagnosis Date Noted  . Calculus of kidney 05/02/2018  . Recurrent urinary tract infection 05/02/2018  . GBS bacteriuria 03/13/2018  . Supervision of other normal pregnancy, antepartum 03/09/2018       Plan:     Initial labs drawn. Prenatal vitamins. Problem list reviewed and updated. AFP3 discussed: {requests/ordered/declines:14581}. Role of ultrasound in pregnancy discussed; fetal survey: {requests/ordered/declines:14581}. Amniocentesis discussed: {amniocentesis:14582}. Follow up in {numbers 0-4:31231} weeks. ***% of *** min visit spent on counseling and coordination of care.  ***   Hansel Feinstein 09/12/2018

## 2018-09-12 NOTE — Progress Notes (Signed)
   PRENATAL VISIT NOTE  Subjective:  Erin Leonard is a 33 y.o. G3T5176 at [redacted]w[redacted]d being seen today for ongoing prenatal care.  She is currently monitored for the following issues for this low-risk pregnancy and has Supervision of other normal pregnancy, antepartum; GBS bacteriuria; Calculus of kidney; and Recurrent urinary tract infection on their problem list.  Patient reports occasional contractions.  Contractions: Irritability.  .  Movement: Present. Denies leaking of fluid.   The following portions of the patient's history were reviewed and updated as appropriate: allergies, current medications, past family history, past medical history, past social history, past surgical history and problem list.   Objective:   Vitals:   09/12/18 0906  BP: 120/71  Pulse: 87  Weight: 82.6 kg    Fetal Status:     Movement: Present     General:  Alert, oriented and cooperative. Patient is in no acute distress.  Skin: Skin is warm and dry. No rash noted.   Cardiovascular: Normal heart rate noted  Respiratory: Normal respiratory effort, no problems with respiration noted  Abdomen: Soft, gravid, appropriate for gestational age.  Pain/Pressure: Present     Pelvic: Cervical exam performed       2/70/-2/vertex   Extremities: Normal range of motion.  Edema: None  Mental Status: Normal mood and affect. Normal behavior. Normal judgment and thought content.   Assessment and Plan:  Pregnancy: H6W7371 at [redacted]w[redacted]d 1. Supervision of other normal pregnancy, antepartum      Labor precautions      Had GBS in urine      Allergic to PCN, will use Clindamycin (sensitive)  - GC/Chlamydia probe amp ([place])not at [facility]  Term labor symptoms and general obstetric precautions including but not limited to vaginal bleeding, contractions, leaking of fluid and fetal movement were reviewed in detail with the patient. Please refer to After Visit Summary for other counseling recommendations.   RTO 1 week  Hansel Feinstein, CNM

## 2018-09-13 LAB — GC/CHLAMYDIA PROBE AMP (~~LOC~~) NOT AT ARMC
Chlamydia: NEGATIVE
Neisseria Gonorrhea: NEGATIVE

## 2018-09-19 ENCOUNTER — Encounter: Payer: Self-pay | Admitting: Advanced Practice Midwife

## 2018-09-19 ENCOUNTER — Other Ambulatory Visit: Payer: Self-pay

## 2018-09-19 ENCOUNTER — Ambulatory Visit (INDEPENDENT_AMBULATORY_CARE_PROVIDER_SITE_OTHER): Payer: Medicaid Other | Admitting: Advanced Practice Midwife

## 2018-09-19 VITALS — BP 122/78 | HR 85 | Wt 185.0 lb

## 2018-09-19 DIAGNOSIS — Z3480 Encounter for supervision of other normal pregnancy, unspecified trimester: Secondary | ICD-10-CM

## 2018-09-19 DIAGNOSIS — Z3483 Encounter for supervision of other normal pregnancy, third trimester: Secondary | ICD-10-CM

## 2018-09-19 DIAGNOSIS — Z3A38 38 weeks gestation of pregnancy: Secondary | ICD-10-CM

## 2018-09-19 NOTE — Progress Notes (Signed)
Patient states she felt like she was in labor last night but then contractions faded. Kathrene Alu RN

## 2018-09-19 NOTE — Progress Notes (Signed)
   PRENATAL VISIT NOTE  Subjective:  Erin Leonard is a 33 y.o. F3L4562 at [redacted]w[redacted]d being seen today for ongoing prenatal care.  She is currently monitored for the following issues for this low-risk pregnancy and has Supervision of other normal pregnancy, antepartum; GBS bacteriuria; Calculus of kidney; and Recurrent urinary tract infection on their problem list.  Patient reports occasional contractions.  Contractions: Irregular. Vag. Bleeding: None.  Movement: Present. Denies leaking of fluid.   The following portions of the patient's history were reviewed and updated as appropriate: allergies, current medications, past family history, past medical history, past social history, past surgical history and problem list.   Objective:   Vitals:   09/19/18 0949  BP: 122/78  Pulse: 85  Weight: 83.9 kg    Fetal Status:     Movement: Present     General:  Alert, oriented and cooperative. Patient is in no acute distress.  Skin: Skin is warm and dry. No rash noted.   Cardiovascular: Normal heart rate noted  Respiratory: Normal respiratory effort, no problems with respiration noted  Abdomen: Soft, gravid, appropriate for gestational age.  Pain/Pressure: Present     Pelvic: Cervical exam performed       Cervix loose 2/70/-2/vtx  Extremities: Normal range of motion.  Edema: None  Mental Status: Normal mood and affect. Normal behavior. Normal judgment and thought content.   Assessment and Plan:  Pregnancy: B6L8937 at [redacted]w[redacted]d There are no diagnoses linked to this encounter. Term labor symptoms and general obstetric precautions including but not limited to vaginal bleeding, contractions, leaking of fluid and fetal movement were reviewed in detail with the patient. Please refer to After Visit Summary for other counseling recommendations.   RTO 1 week   Hansel Feinstein, CNM

## 2018-09-19 NOTE — Patient Instructions (Signed)

## 2018-09-26 ENCOUNTER — Other Ambulatory Visit: Payer: Self-pay | Admitting: Advanced Practice Midwife

## 2018-09-26 ENCOUNTER — Inpatient Hospital Stay (HOSPITAL_COMMUNITY): Payer: Medicaid Other | Admitting: Anesthesiology

## 2018-09-26 ENCOUNTER — Encounter (HOSPITAL_COMMUNITY): Payer: Self-pay

## 2018-09-26 ENCOUNTER — Ambulatory Visit (INDEPENDENT_AMBULATORY_CARE_PROVIDER_SITE_OTHER): Payer: Medicaid Other | Admitting: Advanced Practice Midwife

## 2018-09-26 ENCOUNTER — Inpatient Hospital Stay (HOSPITAL_COMMUNITY)
Admission: AD | Admit: 2018-09-26 | Discharge: 2018-09-29 | DRG: 798 | Disposition: A | Discharge status: Home / Self Care | Payer: Medicaid Other | Attending: Obstetrics & Gynecology | Admitting: Obstetrics & Gynecology

## 2018-09-26 ENCOUNTER — Other Ambulatory Visit: Payer: Self-pay

## 2018-09-26 ENCOUNTER — Encounter: Payer: Self-pay | Admitting: Advanced Practice Midwife

## 2018-09-26 ENCOUNTER — Encounter (HOSPITAL_COMMUNITY): Payer: Self-pay | Admitting: *Deleted

## 2018-09-26 ENCOUNTER — Telehealth (HOSPITAL_COMMUNITY): Payer: Self-pay | Admitting: *Deleted

## 2018-09-26 VITALS — BP 135/72 | HR 89 | Wt 187.0 lb

## 2018-09-26 DIAGNOSIS — O99824 Streptococcus B carrier state complicating childbirth: Principal | ICD-10-CM | POA: Diagnosis present

## 2018-09-26 DIAGNOSIS — Z87891 Personal history of nicotine dependence: Secondary | ICD-10-CM

## 2018-09-26 DIAGNOSIS — Z880 Allergy status to penicillin: Secondary | ICD-10-CM

## 2018-09-26 DIAGNOSIS — Z3A39 39 weeks gestation of pregnancy: Secondary | ICD-10-CM

## 2018-09-26 DIAGNOSIS — D649 Anemia, unspecified: Secondary | ICD-10-CM | POA: Diagnosis present

## 2018-09-26 DIAGNOSIS — Z302 Encounter for sterilization: Secondary | ICD-10-CM | POA: Diagnosis not present

## 2018-09-26 DIAGNOSIS — Z20828 Contact with and (suspected) exposure to other viral communicable diseases: Secondary | ICD-10-CM | POA: Diagnosis present

## 2018-09-26 DIAGNOSIS — Z3483 Encounter for supervision of other normal pregnancy, third trimester: Secondary | ICD-10-CM

## 2018-09-26 DIAGNOSIS — Z3480 Encounter for supervision of other normal pregnancy, unspecified trimester: Secondary | ICD-10-CM

## 2018-09-26 DIAGNOSIS — N390 Urinary tract infection, site not specified: Secondary | ICD-10-CM | POA: Diagnosis present

## 2018-09-26 DIAGNOSIS — O9902 Anemia complicating childbirth: Secondary | ICD-10-CM | POA: Diagnosis present

## 2018-09-26 DIAGNOSIS — R8271 Bacteriuria: Secondary | ICD-10-CM | POA: Diagnosis present

## 2018-09-26 DIAGNOSIS — O26893 Other specified pregnancy related conditions, third trimester: Secondary | ICD-10-CM | POA: Diagnosis present

## 2018-09-26 LAB — CBC
HCT: 32.8 % — ABNORMAL LOW (ref 36.0–46.0)
Hemoglobin: 10.8 g/dL — ABNORMAL LOW (ref 12.0–15.0)
MCH: 29.6 pg (ref 26.0–34.0)
MCHC: 32.9 g/dL (ref 30.0–36.0)
MCV: 89.9 fL (ref 80.0–100.0)
Platelets: 280 10*3/uL (ref 150–400)
RBC: 3.65 MIL/uL — ABNORMAL LOW (ref 3.87–5.11)
RDW: 14.4 % (ref 11.5–15.5)
WBC: 15.7 10*3/uL — ABNORMAL HIGH (ref 4.0–10.5)
nRBC: 0 % (ref 0.0–0.2)

## 2018-09-26 MED ORDER — LACTATED RINGERS IV SOLN
500.0000 mL | INTRAVENOUS | Status: DC | PRN
  Administered 2018-09-27: 500 mL via INTRAVENOUS

## 2018-09-26 MED ORDER — CLINDAMYCIN PHOSPHATE 900 MG/50ML IV SOLN
900.0000 mg | Freq: Three times a day (TID) | INTRAVENOUS | Status: DC
  Administered 2018-09-26 – 2018-09-27 (×2): 900 mg via INTRAVENOUS
  Filled 2018-09-26 (×2): qty 50

## 2018-09-26 MED ORDER — LIDOCAINE HCL (PF) 1 % IJ SOLN
INTRAMUSCULAR | Status: DC | PRN
  Administered 2018-09-26 (×2): 5 mL via EPIDURAL

## 2018-09-26 MED ORDER — ACETAMINOPHEN 325 MG PO TABS: 650.0000 mg | ORAL_TABLET | ORAL | Status: DC | PRN

## 2018-09-26 MED ORDER — PHENYLEPHRINE 40 MCG/ML (10ML) SYRINGE FOR IV PUSH (FOR BLOOD PRESSURE SUPPORT)
80.0000 ug | PREFILLED_SYRINGE | INTRAVENOUS | Status: DC | PRN
  Filled 2018-09-26: qty 10

## 2018-09-26 MED ORDER — ONDANSETRON HCL 4 MG/2ML IJ SOLN
4.0000 mg | Freq: Four times a day (QID) | INTRAMUSCULAR | Status: DC | PRN
  Administered 2018-09-27: 4 mg via INTRAVENOUS
  Filled 2018-09-26: qty 2

## 2018-09-26 MED ORDER — FENTANYL-BUPIVACAINE-NACL 0.5-0.125-0.9 MG/250ML-% EP SOLN: 12.0000 mL/h | EPIDURAL | Status: DC | PRN

## 2018-09-26 MED ORDER — EPHEDRINE 5 MG/ML INJ: 10.0000 mg | INTRAVENOUS | Status: DC | PRN

## 2018-09-26 MED ORDER — FENTANYL CITRATE (PF) 100 MCG/2ML IJ SOLN
100.0000 ug | INTRAMUSCULAR | Status: DC | PRN
  Administered 2018-09-26: 100 ug via INTRAVENOUS
  Filled 2018-09-26: qty 2

## 2018-09-26 MED ORDER — OXYTOCIN 40 UNITS IN NORMAL SALINE INFUSION - SIMPLE MED
2.5000 [IU]/h | INTRAVENOUS | Status: DC
  Filled 2018-09-26: qty 1000

## 2018-09-26 MED ORDER — DIPHENHYDRAMINE HCL 50 MG/ML IJ SOLN
12.5000 mg | INTRAMUSCULAR | Status: DC | PRN
  Administered 2018-09-27: 12.5 mg via INTRAVENOUS
  Filled 2018-09-26: qty 1

## 2018-09-26 MED ORDER — OXYTOCIN BOLUS FROM INFUSION
500.0000 mL | Freq: Once | INTRAVENOUS | Status: AC
  Administered 2018-09-27: 500 mL via INTRAVENOUS

## 2018-09-26 MED ORDER — LACTATED RINGERS IV SOLN
INTRAVENOUS | Status: DC
  Administered 2018-09-26 (×3): via INTRAVENOUS

## 2018-09-26 MED ORDER — SOD CITRATE-CITRIC ACID 500-334 MG/5ML PO SOLN: 30.0000 mL | ORAL | Status: DC | PRN

## 2018-09-26 MED ORDER — PHENYLEPHRINE 40 MCG/ML (10ML) SYRINGE FOR IV PUSH (FOR BLOOD PRESSURE SUPPORT): 80.0000 ug | PREFILLED_SYRINGE | INTRAVENOUS | Status: DC | PRN

## 2018-09-26 MED ORDER — SODIUM CHLORIDE (PF) 0.9 % IJ SOLN
INTRAMUSCULAR | Status: DC | PRN
  Administered 2018-09-26: 12 mL/h via EPIDURAL

## 2018-09-26 MED ORDER — OXYCODONE-ACETAMINOPHEN 5-325 MG PO TABS: 2.0000 | ORAL_TABLET | ORAL | Status: DC | PRN

## 2018-09-26 MED ORDER — FENTANYL-BUPIVACAINE-NACL 0.5-0.125-0.9 MG/250ML-% EP SOLN
12.0000 mL/h | EPIDURAL | Status: DC | PRN
  Filled 2018-09-26: qty 250

## 2018-09-26 MED ORDER — LIDOCAINE HCL (PF) 1 % IJ SOLN
30.0000 mL | INTRAMUSCULAR | Status: DC | PRN
  Filled 2018-09-26: qty 30

## 2018-09-26 MED ORDER — OXYCODONE-ACETAMINOPHEN 5-325 MG PO TABS: 1.0000 | ORAL_TABLET | ORAL | Status: DC | PRN

## 2018-09-26 MED ORDER — LACTATED RINGERS IV SOLN
500.0000 mL | Freq: Once | INTRAVENOUS | Status: AC
  Administered 2018-09-26: 500 mL via INTRAVENOUS

## 2018-09-26 NOTE — Anesthesia Procedure Notes (Signed)
Epidural Patient location during procedure: OB  Staffing Anesthesiologist: Namiah Dunnavant, MD Performed: anesthesiologist   Preanesthetic Checklist Completed: patient identified, site marked, surgical consent, pre-op evaluation, timeout performed, IV checked, risks and benefits discussed and monitors and equipment checked  Epidural Patient position: sitting Prep: DuraPrep Patient monitoring: heart rate, continuous pulse ox and blood pressure Approach: right paramedian Location: L3-L4 Injection technique: LOR saline  Needle:  Needle type: Tuohy  Needle gauge: 17 G Needle length: 9 cm and 9 Needle insertion depth: 6 cm Catheter type: closed end flexible Catheter size: 20 Guage Catheter at skin depth: 10 cm Test dose: negative  Assessment Events: blood not aspirated, injection not painful, no injection resistance, negative IV test and no paresthesia  Additional Notes Patient identified. Risks/Benefits/Options discussed with patient including but not limited to bleeding, infection, nerve damage, paralysis, failed block, incomplete pain control, headache, blood pressure changes, nausea, vomiting, reactions to medication both or allergic, itching and postpartum back pain. Confirmed with bedside nurse the patient's most recent platelet count. Confirmed with patient that they are not currently taking any anticoagulation, have any bleeding history or any family history of bleeding disorders. Patient expressed understanding and wished to proceed. All questions were answered. Sterile technique was used throughout the entire procedure. Please see nursing notes for vital signs. Test dose was given through epidural needle and negative prior to continuing to dose epidural or start infusion. Warning signs of high block given to the patient including shortness of breath, tingling/numbness in hands, complete motor block, or any concerning symptoms with instructions to call for help. Patient was given  instructions on fall risk and not to get out of bed. All questions and concerns addressed with instructions to call with any issues.     

## 2018-09-26 NOTE — MAU Note (Signed)
Pt here with c/o contractions; was 3 cm this AM and had membranes stripped in the office. Reports good fetal movement. Denies any leaking.

## 2018-09-26 NOTE — H&P (Signed)
Erin Leonard is a 33 y.o. female [id number] @ 39.1wks by LMP and confirmed by 10wk scan presenting for reg ctx. Denies leaking or bldg; no H/A, N/V or visual disturbances. Her preg has been followed by the [facility] and has been remarkable for:  # GBS bacteruira # PCN allergy # desires sterilization- papers signed 07/21/18  OB History    Gravida  5   Para  4   Term  4   Preterm  0   AB  0   Living  4     SAB  0   TAB  0   Ectopic  0   Multiple  0   Live Births  4          Past Medical History:  Diagnosis Date  . Chronic kidney disease    kidney stone in 2015   Past Surgical History:  Procedure Laterality Date  . BREAST SURGERY    . WISDOM TOOTH EXTRACTION     Family History: family history includes Diabetes in her father; Hypertension in her father. Social History:  reports that she quit smoking about 4 years ago. Her smoking use included cigarettes. She has never used smokeless tobacco. She reports that she does not drink alcohol or use drugs.     Maternal Diabetes: No- cannot find test result Genetic Screening: Normal Maternal Ultrasounds/Referrals: Normal  Fetal Ultrasounds or other Referrals:  None Maternal Substance Abuse:  No Significant Maternal Medications:  None Significant Maternal Lab Results:  Group B Strep positive Other Comments:  None History Dilation: 5 Effacement (%): 80 Station: -1 Exam by:: [employer] RN Height 5\' 3"  (1.6 m), weight 85.3 kg, last menstrual period 12/26/2017, SpO2 99 %, unknown if currently breastfeeding.   Vitals:   09/26/18 2232 09/26/18 2345  BP:  116/65  Pulse:  71  SpO2: 99% 97%    Exam  Physical Exam  Constitutional: She is oriented to person, place, and time. She appears well-developed.  HENT:  Head: Normocephalic.  Neck: Normal range of motion.  GI:  EFM 150, +accels, no decels, Cat 1 Ctx q 3-84mins  Musculoskeletal: Normal range of motion.  Neurological: She is alert and oriented to  person, place, and time.  Skin: Skin is warm and dry.  Psychiatric: She has a normal mood and affect. Her behavior is normal. Thought content normal.    Prenatal labs: ABO, Rh: O/Positive/-- (01/23 1028) Antibody: Negative (01/23 1028) Rubella: 1.21 (01/23 1028) RPR: Non Reactive (01/23 1028)  HBsAg: Negative (01/23 1028)  HIV: Non Reactive (01/23 1028)  GBS:   positive on urine culture 03/09/18  Assessment/Plan: IUP@term  Latent labor GBS pos  Admit to Labor and Delivery Expectant management Plan Clindamycin for GBS ppx Anticipate SVD Plan for ppBTL   Myrtis Ser CNM 09/26/2018, 10:45 PM

## 2018-09-26 NOTE — Anesthesia Preprocedure Evaluation (Signed)
Anesthesia Evaluation  Patient identified by MRN, date of birth, ID band Patient awake    Reviewed: Allergy & Precautions, H&P , NPO status , Patient's Chart, lab work & pertinent test results  History of Anesthesia Complications Negative for: history of anesthetic complications  Airway Mallampati: II  TM Distance: >3 FB Neck ROM: full    Dental no notable dental hx. (+) Teeth Intact   Pulmonary neg pulmonary ROS, former smoker,    Pulmonary exam normal breath sounds clear to auscultation       Cardiovascular negative cardio ROS Normal cardiovascular exam Rhythm:regular Rate:Normal     Neuro/Psych negative neurological ROS  negative psych ROS   GI/Hepatic negative GI ROS, Neg liver ROS,   Endo/Other  negative endocrine ROS  Renal/GU negative Renal ROS  negative genitourinary   Musculoskeletal   Abdominal   Peds  Hematology negative hematology ROS (+)   Anesthesia Other Findings   Reproductive/Obstetrics (+) Pregnancy                             Anesthesia Physical Anesthesia Plan  ASA: II  Anesthesia Plan: Epidural   Post-op Pain Management:    Induction:   PONV Risk Score and Plan:   Airway Management Planned:   Additional Equipment:   Intra-op Plan:   Post-operative Plan:   Informed Consent: I have reviewed the patients History and Physical, chart, labs and discussed the procedure including the risks, benefits and alternatives for the proposed anesthesia with the patient or authorized representative who has indicated his/her understanding and acceptance.       Plan Discussed with:   Anesthesia Plan Comments: (Covid pending. Full PPE including gown and CAPR)        Anesthesia Quick Evaluation

## 2018-09-26 NOTE — Progress Notes (Signed)
   PRENATAL VISIT NOTE  Subjective:  Erin Leonard is a 33 y.o. G3O7564 at [redacted]w[redacted]d being seen today for ongoing prenatal care.  She is currently monitored for the following issues for this low-risk pregnancy and has Supervision of other normal pregnancy, antepartum; GBS bacteriuria; Calculus of kidney; and Recurrent urinary tract infection on their problem list.  Patient reports occasional contractions.  Contractions: Not present. Vag. Bleeding: None.  Movement: Present. Denies leaking of fluid. Requests membrane sweeping  The following portions of the patient's history were reviewed and updated as appropriate: allergies, current medications, past family history, past medical history, past social history, past surgical history and problem list.   Objective:   Vitals:   09/26/18 0855  BP: 135/72  Pulse: 89  Weight: 84.8 kg    Fetal Status:     Movement: Present     General:  Alert, oriented and cooperative. Patient is in no acute distress.  Skin: Skin is warm and dry. No rash noted.   Cardiovascular: Normal heart rate noted  Respiratory: Normal respiratory effort, no problems with respiration noted  Abdomen: Soft, gravid, appropriate for gestational age.  Pain/Pressure: Present     Pelvic: Cervical exam performed        Cervix 3/80/-1-2/vertex  Membranes swept.  Slight bulging to membranes  Extremities: Normal range of motion.  Edema: Trace  Mental Status: Normal mood and affect. Normal behavior. Normal judgment and thought content.   Assessment and Plan:  Pregnancy: P3I9518 at [redacted]w[redacted]d Patient Active Problem List   Diagnosis Date Noted  . Calculus of kidney 05/02/2018  . Recurrent urinary tract infection 05/02/2018  . GBS bacteriuria 03/13/2018  . Supervision of other normal pregnancy, antepartum 03/09/2018    Term labor symptoms and general obstetric precautions including but not limited to vaginal bleeding, contractions, leaking of fluid and fetal movement were reviewed in  detail with the patient. Please refer to After Visit Summary for other counseling recommendations.   RTO 1 week  Hansel Feinstein, CNM

## 2018-09-26 NOTE — Telephone Encounter (Signed)
Preadmission screen  

## 2018-09-26 NOTE — Patient Instructions (Signed)

## 2018-09-27 ENCOUNTER — Encounter (HOSPITAL_COMMUNITY)
Admission: AD | Disposition: A | Discharge status: Home / Self Care | Payer: Self-pay | Source: Home / Self Care | Attending: Obstetrics & Gynecology

## 2018-09-27 ENCOUNTER — Inpatient Hospital Stay (HOSPITAL_COMMUNITY): Payer: Medicaid Other | Admitting: Anesthesiology

## 2018-09-27 ENCOUNTER — Encounter (HOSPITAL_COMMUNITY): Payer: Self-pay

## 2018-09-27 DIAGNOSIS — Z3A39 39 weeks gestation of pregnancy: Secondary | ICD-10-CM

## 2018-09-27 DIAGNOSIS — O99824 Streptococcus B carrier state complicating childbirth: Secondary | ICD-10-CM

## 2018-09-27 DIAGNOSIS — Z302 Encounter for sterilization: Secondary | ICD-10-CM

## 2018-09-27 HISTORY — PX: TUBAL LIGATION: SHX77

## 2018-09-27 LAB — ABO/RH: ABO/RH(D): O POS

## 2018-09-27 LAB — TYPE AND SCREEN
ABO/RH(D): O POS
Antibody Screen: NEGATIVE

## 2018-09-27 LAB — RPR: RPR Ser Ql: NONREACTIVE

## 2018-09-27 LAB — SARS CORONAVIRUS 2 BY RT PCR (HOSPITAL ORDER, PERFORMED IN ~~LOC~~ HOSPITAL LAB): SARS Coronavirus 2: NEGATIVE

## 2018-09-27 SURGERY — LIGATION, FALLOPIAN TUBE, POSTPARTUM
Anesthesia: Epidural | Laterality: Bilateral

## 2018-09-27 MED ORDER — FENTANYL CITRATE (PF) 100 MCG/2ML IJ SOLN: 25.0000 ug | INTRAMUSCULAR | Status: DC | PRN

## 2018-09-27 MED ORDER — LACTATED RINGERS IV SOLN
INTRAVENOUS | Status: DC
  Administered 2018-09-27 (×2): via INTRAVENOUS

## 2018-09-27 MED ORDER — ONDANSETRON HCL 4 MG/2ML IJ SOLN
INTRAMUSCULAR | Status: DC | PRN
  Administered 2018-09-27: 4 mg via INTRAVENOUS

## 2018-09-27 MED ORDER — FAMOTIDINE 20 MG PO TABS
40.0000 mg | ORAL_TABLET | Freq: Once | ORAL | Status: AC
  Administered 2018-09-27: 40 mg via ORAL
  Filled 2018-09-27: qty 2

## 2018-09-27 MED ORDER — ONDANSETRON HCL 4 MG/2ML IJ SOLN: 4.0000 mg | INTRAMUSCULAR | Status: DC | PRN

## 2018-09-27 MED ORDER — BUPIVACAINE HCL (PF) 0.25 % IJ SOLN
INTRAMUSCULAR | Status: AC
  Filled 2018-09-27: qty 20

## 2018-09-27 MED ORDER — MIDAZOLAM HCL 2 MG/2ML IJ SOLN
INTRAMUSCULAR | Status: AC
  Filled 2018-09-27: qty 2

## 2018-09-27 MED ORDER — PRENATAL MULTIVITAMIN CH
1.0000 | ORAL_TABLET | Freq: Every day | ORAL | Status: DC
  Administered 2018-09-28 – 2018-09-29 (×2): 1 via ORAL
  Filled 2018-09-27 (×2): qty 1

## 2018-09-27 MED ORDER — ONDANSETRON HCL 4 MG/2ML IJ SOLN: 4.0000 mg | Freq: Once | INTRAMUSCULAR | Status: DC | PRN

## 2018-09-27 MED ORDER — SODIUM BICARBONATE 8.4 % IV SOLN
INTRAVENOUS | Status: AC
  Filled 2018-09-27: qty 50

## 2018-09-27 MED ORDER — METOCLOPRAMIDE HCL 10 MG PO TABS
10.0000 mg | ORAL_TABLET | Freq: Once | ORAL | Status: AC
  Administered 2018-09-27: 10 mg via ORAL
  Filled 2018-09-27: qty 1

## 2018-09-27 MED ORDER — LIDOCAINE 2% (20 MG/ML) 5 ML SYRINGE
INTRAMUSCULAR | Status: AC
  Filled 2018-09-27: qty 5

## 2018-09-27 MED ORDER — IBUPROFEN 600 MG PO TABS
600.0000 mg | ORAL_TABLET | Freq: Four times a day (QID) | ORAL | Status: DC
  Administered 2018-09-27 – 2018-09-29 (×7): 600 mg via ORAL
  Filled 2018-09-27 (×8): qty 1

## 2018-09-27 MED ORDER — TETANUS-DIPHTH-ACELL PERTUSSIS 5-2.5-18.5 LF-MCG/0.5 IM SUSP
0.5000 mL | Freq: Once | INTRAMUSCULAR | Status: AC
  Administered 2018-09-29: 0.5 mL via INTRAMUSCULAR
  Filled 2018-09-27: qty 0.5

## 2018-09-27 MED ORDER — FENTANYL CITRATE (PF) 100 MCG/2ML IJ SOLN
INTRAMUSCULAR | Status: AC
  Filled 2018-09-27: qty 2

## 2018-09-27 MED ORDER — OXYCODONE HCL 5 MG PO TABS: 5.0000 mg | ORAL_TABLET | Freq: Once | ORAL | Status: DC | PRN

## 2018-09-27 MED ORDER — ONDANSETRON HCL 4 MG PO TABS: 4.0000 mg | ORAL_TABLET | ORAL | Status: DC | PRN

## 2018-09-27 MED ORDER — OXYTOCIN 40 UNITS IN NORMAL SALINE INFUSION - SIMPLE MED
1.0000 m[IU]/min | INTRAVENOUS | Status: DC
  Administered 2018-09-27: 04:00:00 1 m[IU]/min via INTRAVENOUS

## 2018-09-27 MED ORDER — COCONUT OIL OIL: 1.0000 "application " | TOPICAL_OIL | Status: DC | PRN

## 2018-09-27 MED ORDER — TERBUTALINE SULFATE 1 MG/ML IJ SOLN: 0.2500 mg | Freq: Once | INTRAMUSCULAR | Status: DC | PRN

## 2018-09-27 MED ORDER — MIDAZOLAM HCL 2 MG/2ML IJ SOLN
INTRAMUSCULAR | Status: DC | PRN
  Administered 2018-09-27 (×2): 1 mg via INTRAVENOUS

## 2018-09-27 MED ORDER — FENTANYL CITRATE (PF) 100 MCG/2ML IJ SOLN
INTRAMUSCULAR | Status: DC | PRN
  Administered 2018-09-27 (×2): 50 ug via INTRAVENOUS

## 2018-09-27 MED ORDER — DIBUCAINE (PERIANAL) 1 % EX OINT: 1.0000 "application " | TOPICAL_OINTMENT | CUTANEOUS | Status: DC | PRN

## 2018-09-27 MED ORDER — ZOLPIDEM TARTRATE 5 MG PO TABS: 5.0000 mg | ORAL_TABLET | Freq: Every evening | ORAL | Status: DC | PRN

## 2018-09-27 MED ORDER — SIMETHICONE 80 MG PO CHEW: 80.0000 mg | CHEWABLE_TABLET | ORAL | Status: DC | PRN

## 2018-09-27 MED ORDER — WITCH HAZEL-GLYCERIN EX PADS: 1.0000 "application " | MEDICATED_PAD | CUTANEOUS | Status: DC | PRN

## 2018-09-27 MED ORDER — ACETAMINOPHEN 325 MG PO TABS: 650.0000 mg | ORAL_TABLET | ORAL | Status: DC | PRN

## 2018-09-27 MED ORDER — BUPIVACAINE HCL (PF) 0.25 % IJ SOLN
INTRAMUSCULAR | Status: AC
  Filled 2018-09-27: qty 10

## 2018-09-27 MED ORDER — ONDANSETRON HCL 4 MG/2ML IJ SOLN
INTRAMUSCULAR | Status: AC
  Filled 2018-09-27: qty 2

## 2018-09-27 MED ORDER — DIPHENHYDRAMINE HCL 25 MG PO CAPS: 25.0000 mg | ORAL_CAPSULE | Freq: Four times a day (QID) | ORAL | Status: DC | PRN

## 2018-09-27 MED ORDER — LACTATED RINGERS IV SOLN
INTRAVENOUS | Status: DC | PRN
  Administered 2018-09-27: 17:00:00 via INTRAVENOUS

## 2018-09-27 MED ORDER — SENNOSIDES-DOCUSATE SODIUM 8.6-50 MG PO TABS
2.0000 | ORAL_TABLET | ORAL | Status: DC
  Administered 2018-09-27 – 2018-09-28 (×2): 2 via ORAL
  Filled 2018-09-27 (×2): qty 2

## 2018-09-27 MED ORDER — SODIUM BICARBONATE 8.4 % IV SOLN
INTRAVENOUS | Status: DC | PRN
  Administered 2018-09-27 (×4): 5 mL via EPIDURAL

## 2018-09-27 MED ORDER — OXYCODONE HCL 5 MG/5ML PO SOLN: 5.0000 mg | Freq: Once | ORAL | Status: DC | PRN

## 2018-09-27 MED ORDER — BUPIVACAINE HCL (PF) 0.25 % IJ SOLN
INTRAMUSCULAR | Status: DC | PRN
  Administered 2018-09-27: 7 mL

## 2018-09-27 MED ORDER — BENZOCAINE-MENTHOL 20-0.5 % EX AERO
1.0000 "application " | INHALATION_SPRAY | CUTANEOUS | Status: DC | PRN
  Administered 2018-09-29: 1 via TOPICAL
  Filled 2018-09-27: qty 56

## 2018-09-27 SURGICAL SUPPLY — 30 items
BLADE SURG 15 STRL LF C SS BP (BLADE) ×1 IMPLANT
BLADE SURG 15 STRL SS (BLADE) ×2
CLIP FILSHIE TUBAL LIGA STRL (Clip) ×3 IMPLANT
CLOTH BEACON ORANGE TIMEOUT ST (SAFETY) ×3 IMPLANT
DERMABOND ADVANCED (GAUZE/BANDAGES/DRESSINGS) ×2
DERMABOND ADVANCED .7 DNX12 (GAUZE/BANDAGES/DRESSINGS) ×1 IMPLANT
DRSG OPSITE POSTOP 3X4 (GAUZE/BANDAGES/DRESSINGS) ×3 IMPLANT
ELECT REM PT RETURN 9FT ADLT (ELECTROSURGICAL)
ELECTRODE REM PT RTRN 9FT ADLT (ELECTROSURGICAL) IMPLANT
GLOVE BIOGEL PI IND STRL 7.0 (GLOVE) ×1 IMPLANT
GLOVE BIOGEL PI IND STRL 8 (GLOVE) ×1 IMPLANT
GLOVE BIOGEL PI INDICATOR 7.0 (GLOVE) ×2
GLOVE BIOGEL PI INDICATOR 8 (GLOVE) ×2
GLOVE ECLIPSE 8.0 STRL XLNG CF (GLOVE) ×3 IMPLANT
GOWN STRL REUS W/TWL LRG LVL3 (GOWN DISPOSABLE) ×6 IMPLANT
NEEDLE HYPO 22GX1.5 SAFETY (NEEDLE) IMPLANT
NS IRRIG 1000ML POUR BTL (IV SOLUTION) ×3 IMPLANT
PACK ABDOMINAL MINOR (CUSTOM PROCEDURE TRAY) ×3 IMPLANT
PENCIL BUTTON HOLSTER BLD 10FT (ELECTRODE) IMPLANT
PROTECTOR NERVE ULNAR (MISCELLANEOUS) ×3 IMPLANT
SPONGE LAP 4X18 RFD (DISPOSABLE) IMPLANT
SUT PLAIN 2 0 (SUTURE) ×4
SUT PLAIN ABS 2-0 CT1 27XMFL (SUTURE) ×2 IMPLANT
SUT VIC AB 0 CT1 27 (SUTURE) ×2
SUT VIC AB 0 CT1 27XBRD ANBCTR (SUTURE) ×1 IMPLANT
SUT VIC AB 4-0 KS 27 (SUTURE) ×3 IMPLANT
SUT VIC AB 4-0 PS2 27 (SUTURE) ×3 IMPLANT
SYR CONTROL 10ML LL (SYRINGE) IMPLANT
TOWEL OR 17X24 6PK STRL BLUE (TOWEL DISPOSABLE) ×6 IMPLANT
TRAY FOLEY CATH SILVER 14FR (SET/KITS/TRAYS/PACK) ×3 IMPLANT

## 2018-09-27 NOTE — Progress Notes (Signed)
33 y.o. J6B3419 with undesired fertility desires permanent sterilization. Risks and benefits of tubal sterilization procedure was discussed with the patient including permanence of method, bleeding, infection, injury to surrounding organs, anesthesia and need for additional procedures. Risk failure of 0.5-1% with increased risk of ectopic gestation if pregnancy occurs was also discussed with patient. Patient verbalized understanding and all questions were answered.  Alvina Filbert Roselie Awkward MD 09/27/2018  Patient ID: Erin Leonard, female   DOB: [birth year], 33 y.o.   MRN: [id number]

## 2018-09-27 NOTE — Progress Notes (Signed)
Comfortable with epidural; feeling pressure; on Pitocin; Clinda running (  BP 115/71, P 95 FHR 150, moderate variability, frequent variables, +accels Ctx q 2-3 mins  Cx 9.5/100/Bulging Bag, vtx at 0  IUP@term  Active labor GBS pos, on Clinda  AROM at 0831, repeat cervical exam 9.5/100/+1 Anticipate SVD  [place], DO 09/27/18, 8295

## 2018-09-27 NOTE — Transfer of Care (Signed)
Immediate Anesthesia Transfer of Care Note  Patient: Erin Leonard  Procedure(s) Performed: POST PARTUM TUBAL LIGATION (Bilateral )  Patient Location: PACU  Anesthesia Type:Epidural  Level of Consciousness: sedated  Airway & Oxygen Therapy: Patient Spontanous Breathing  Post-op Assessment: Report given to RN  Post vital signs: Reviewed and stable  Last Vitals:  Vitals Value Taken Time  BP    Temp    Pulse    Resp    SpO2      Last Pain:  Vitals:   09/27/18 1635  TempSrc: Oral  PainSc:          Complications: No apparent anesthesia complications

## 2018-09-27 NOTE — Discharge Summary (Signed)
OB Discharge Summary     Patient Name: Erin Leonard DOB: Feb 06, 1986 MRN: [id number]  Date of admission: 09/26/2018 Delivering MD: Merilyn Baba   Date of discharge: 09/29/2018  Admitting diagnosis: ctx  Intrauterine pregnancy: [redacted]w[redacted]d     Secondary diagnosis:  Active Problems:   GBS bacteriuria   Recurrent urinary tract infection   Request for sterilization   Labor and delivery indication for care or intervention  Additional problems: none     Discharge diagnosis: Term Pregnancy Delivered                                                                                                Post partum procedures:postpartum tubal ligation  Augmentation: AROM and Pitocin  Complications: None  Hospital course:  Onset of Labor With Vaginal Delivery     33 y.o. yo [id number] at [redacted]w[redacted]d was admitted in Latent Labor on 09/26/2018. Patient had an uncomplicated labor course as follows:  Membrane Rupture Time/Date: 8:31 AM ,09/27/2018   Intrapartum Procedures: Episiotomy: None [1]                                         Lacerations:  None [1]  Patient had a delivery of a Viable infant. 09/27/2018  Information for the patient's newborn:  Nikka, Hakimian [[id number]]       Pateint had an uncomplicated postpartum course.  She is ambulating, tolerating a regular diet, passing flatus, and urinating well. Patient is discharged home in stable condition on 09/29/18.   Physical exam  Vitals:   09/27/18 1101 09/27/18 1117 09/27/18 1132 09/27/18 1207  BP: 113/68 112/70 111/64 104/77  Pulse: (!) 105 82 71 71  Resp: 17  16 18   Temp:    (!) 97.5 F (36.4 C)  TempSrc:    Axillary  SpO2:    98%  Weight:      Height:       General: alert, cooperative and no distress Lochia: appropriate Uterine Fundus: firm Incision: Healing well with no significant drainage, No significant erythema, Dressing is clean, dry, and intact DVT Evaluation: No significant calf/ankle edema. Labs: Lab Results   Component Value Date   WBC 15.7 (H) 09/26/2018   HGB 10.8 (L) 09/26/2018   HCT 32.8 (L) 09/26/2018   MCV 89.9 09/26/2018   PLT 280 09/26/2018   CMP Latest Ref Rng & Units 01/30/2018  Glucose 70 - 99 mg/dL 98  BUN 6 - 20 mg/dL 9  Creatinine 0.44 - 1.00 mg/dL 0.71  Sodium 135 - 145 mmol/L 134(L)  Potassium 3.5 - 5.1 mmol/L 3.7  Chloride 98 - 111 mmol/L 106  CO2 22 - 32 mmol/L 22  Calcium 8.9 - 10.3 mg/dL 8.8(L)  Total Protein 6.5 - 8.1 g/dL 6.9  Total Bilirubin 0.3 - 1.2 mg/dL 0.3  Alkaline Phos 38 - 126 U/L 46  AST 15 - 41 U/L 21  ALT 0 - 44 U/L 15    Discharge instruction: per After Visit Summary and "Baby and Me  Booklet".  After visit meds:  Allergies as of 09/29/2018      Reactions   Penicillins Hives, Rash   Vancomycin Rash      Medication List    TAKE these medications   acetaminophen 325 MG tablet Commonly known as: Tylenol Take 2 tablets (650 mg total) by mouth every 4 (four) hours as needed (for pain scale < 4).   ibuprofen 600 MG tablet Commonly known as: ADVIL Take 1 tablet (600 mg total) by mouth every 6 (six) hours.   Prenatal Vitamins 28-0.8 MG Tabs Take 1 tablet by mouth daily.       Diet: routine diet  Activity: Advance as tolerated. Pelvic rest for 6 weeks.   Outpatient follow up:4 weeks Follow up Appt: Future Appointments  Date Time Provider Department Center  10/01/2018  9:40 AM [facility] 1 [facility] None  11/16/2018  9:00 AM Levie HeritageStinson, Jacob J, DO [facility] None   Follow up Visit:No follow-ups on file.  Postpartum contraception: Tubal Ligation  Newborn Data: Live born female  Birth Weight: 7 lb 12.9 oz (3540 g) APGAR: 9, 9  Newborn Delivery   Birth date/time: 09/27/2018 09:43:00 Delivery type: Vaginal, Spontaneous      Baby Feeding: Breast Disposition:home with mother   09/29/2018 Randa EvensSimone Autry-Lott, DO

## 2018-09-27 NOTE — Anesthesia Preprocedure Evaluation (Signed)
Anesthesia Evaluation  Patient identified by MRN, date of birth, ID band Patient awake    Reviewed: Allergy & Precautions, H&P , NPO status , Patient's Chart, lab work & pertinent test results  History of Anesthesia Complications Negative for: history of anesthetic complications  Airway Mallampati: II  TM Distance: >3 FB Neck ROM: full    Dental no notable dental hx. (+) Teeth Intact   Pulmonary former smoker,    Pulmonary exam normal breath sounds clear to auscultation       Cardiovascular negative cardio ROS Normal cardiovascular exam Rhythm:Regular Rate:Normal     Neuro/Psych negative neurological ROS  negative psych ROS   GI/Hepatic Neg liver ROS, GERD  ,  Endo/Other  negative endocrine ROS  Renal/GU Renal diseaseHx/o renal calculus  negative genitourinary   Musculoskeletal   Abdominal   Peds  Hematology  (+) anemia ,   Anesthesia Other Findings   Reproductive/Obstetrics Desires sterilization Post Partum                             Anesthesia Physical  Anesthesia Plan  ASA: II  Anesthesia Plan: Epidural   Post-op Pain Management:    Induction:   PONV Risk Score and Plan: 4 or greater and Ondansetron, Dexamethasone, Treatment may vary due to age or medical condition and Scopolamine patch - Pre-op  Airway Management Planned: Natural Airway  Additional Equipment:   Intra-op Plan:   Post-operative Plan:   Informed Consent: I have reviewed the patients History and Physical, chart, labs and discussed the procedure including the risks, benefits and alternatives for the proposed anesthesia with the patient or authorized representative who has indicated his/her understanding and acceptance.     Dental advisory given  Plan Discussed with: CRNA and Surgeon  Anesthesia Plan Comments:         Anesthesia Quick Evaluation

## 2018-09-27 NOTE — Op Note (Signed)
Erin Leonard 09/27/2018  PREOPERATIVE DIAGNOSIS:  Multiparity, undesired fertility  POSTOPERATIVE DIAGNOSIS:  Multiparity, undesired fertility  PROCEDURE:  Postpartum Bilateral Tubal Sterilization using Filshie Clips   ANESTHESIA:  Epidural  COMPLICATIONS:  None immediate.  ESTIMATED BLOOD LOSS:  Less than 20 ml.  FLUIDS: 800 ml LR.  URINE OUTPUT:  50 ml of clear urine.  INDICATIONS: 33 y.o. P7H4327  with undesired fertility,status post vaginal delivery, desires permanent sterilization. Risks and benefits of procedure discussed with patient including permanence of method, bleeding, infection, injury to surrounding organs and need for additional procedures. Risk failure of 0.5-1% with increased risk of ectopic gestation if pregnancy occurs was also discussed with patient.   FINDINGS:  Normal uterus, tubes, and ovaries.  TECHNIQUE:  The patient was taken to the operating room where her epidural anesthesia was dosed up to surgical level and found to be adequate.  She was then placed in the dorsal supine position and prepped and draped in sterile fashion.  After an adequate timeout was performed, attention was turned to the patient's abdomen where a small transverse skin incision was made under the umbilical fold. The incision was taken down to the layer of fascia using the scalpel, and fascia was incised, and extended bilaterally using Mayo scissors. The peritoneum was entered in a sharp fashion. Attention was then turned to the patient's uterus, and left fallopian tube was identified and followed out to the fimbriated end.  A Filshie clip was placed on the left fallopian tube about 2 cm from the cornual attachment, with care given to incorporate the underlying mesosalpinx.  A similar process was carried out on the right side allowing for bilateral tubal sterilization.  Good hemostasis was noted overall.  Local analgesia was drizzled on both operative sites.The instruments were then removed  from the patient's abdomen and the fascial incision was repaired with 0 Vicryl, and the skin was closed with a 4-0 Vicryl subcuticular stitch. The patient tolerated the procedure well.  Sponge, lap, and needle counts were correct times two.  The patient was then taken to the recovery room awake, extubated and in stable condition.  Emeterio Reeve MD 09/27/2018 5:25 PM

## 2018-09-27 NOTE — Lactation Note (Signed)
This note was copied from a baby's chart. Lactation Consultation Note  Patient Name: Erin Leonard [id number] Date: 09/27/2018 Reason for consult: Initial assessment;Term  2123 - 2138 - I conducted an initial visit with Erin Leonard. She states that her son, Erin Leonard, latched at delivery and has latched well several times since birth. He last fed around 4 pm today; he has been too sleepy to feed per mom. I offered to assist with breast feeding, and she agreed.  I unswaddled him and found a large meconium diaper. I changed his diaper and he appeared un-phased. He woke up a bit, but he never fussed. I asked mom to hand express her colostrum. Her breasts are pendulous and her nipples everted and WNL. She hand expressed colostrum, and we placed baby in football hold on her right breast.  He was too sleepy to latch. I encouraged her to attempt to wake him again in an hour and to monitor for feeding cues.  Mom does not have a breast pump, but she does have [facility]. She is planning to return to work at some stage, per mom.  Plan: Breast feed on demand 8-12 times a day, waking to feed as needed. Practice hand expression prior to putting on breast and between feedings for stimulation. Call RN or [place] tonight if baby continues to be sleepy.  Maternal Data Formula Feeding for Exclusion: No Has patient been taught Hand Expression?: Yes Does the patient have breastfeeding experience prior to this delivery?: Yes  LATCH Score Latch: Too sleepy or reluctant, no latch achieved, no sucking elicited.  Audible Swallowing: None  Type of Nipple: Everted at rest and after stimulation  Comfort (Breast/Nipple): Soft / non-tender  Hold (Positioning): Assistance needed to correctly position infant at breast and maintain latch.  LATCH Score: 5  Interventions Interventions: Breast feeding basics reviewed;Assisted with latch;Hand express;Support pillows  Lactation Tools Discussed/Used WIC  Program: Yes   Consult Status Consult Status: Follow-up Date: 09/28/18 Follow-up type: In-patient    Lenore Manner 09/27/2018, 9:52 PM

## 2018-09-27 NOTE — Progress Notes (Signed)
Patient ID: Erin Leonard, female   DOB: 17-Feb-1985, 33 y.o.   MRN: [id number]  Comfortable with epidural; not feeling pressure; was started on Pit 1x1 to strengthen ctx; Clinda x 1 dose  BP 118/79, P 97 FHR 155, frequent early variables, +accels Ctx q 3-5 mins with Pit at 35mu/min Cx 8/90/BBOW, vtx at 0  IUP@term  Active labor GBS pos  Plan for AROM later this morning Anticipate SVD  Myrtis Ser [facility] 09/27/2018

## 2018-09-27 NOTE — Anesthesia Postprocedure Evaluation (Signed)
Anesthesia Post Note  Patient: Erin Leonard  Procedure(s) Performed: AN AD [place]     Patient location during evaluation: Mother Baby Anesthesia Type: Epidural Level of consciousness: awake Pain management: satisfactory to patient Vital Signs Assessment: post-procedure vital signs reviewed and stable Respiratory status: spontaneous breathing Cardiovascular status: stable Anesthetic complications: no    Last Vitals:  Vitals:   09/27/18 1207 09/27/18 1307  BP: 104/77 114/71  Pulse: 71 75  Resp: 18 17  Temp: (!) 36.4 C 37.2 C  SpO2: 98% 100%    Last Pain:  Vitals:   09/27/18 1500  TempSrc:   PainSc: 0-No pain   Pain Goal:                   [employer]

## 2018-09-28 LAB — CBC
HCT: 27.7 % — ABNORMAL LOW (ref 36.0–46.0)
Hemoglobin: 9 g/dL — ABNORMAL LOW (ref 12.0–15.0)
MCH: 29.5 pg (ref 26.0–34.0)
MCHC: 32.5 g/dL (ref 30.0–36.0)
MCV: 90.8 fL (ref 80.0–100.0)
Platelets: 201 10*3/uL (ref 150–400)
RBC: 3.05 MIL/uL — ABNORMAL LOW (ref 3.87–5.11)
RDW: 14.9 % (ref 11.5–15.5)
WBC: 16.6 10*3/uL — ABNORMAL HIGH (ref 4.0–10.5)
nRBC: 0 % (ref 0.0–0.2)

## 2018-09-28 NOTE — Progress Notes (Signed)
POSTPARTUM PROGRESS NOTE  Post Partum Day 1  Subjective:  Erin Leonard is a 33 y.o. F6C1275 s/p VSD at [redacted]w[redacted]d and POD#1 BTL .  No acute events overnight.  Pt denies problems with ambulating, voiding or po intake.  She denies nausea or vomiting.  Pain is poorly controlled.  She has had flatus. She has not had bowel movement.  Lochia Moderate.  Incision area has dry blood but otherwise looks dry and intact.  Objective: Blood pressure 114/74, pulse 86, temperature 97.9 F (36.6 C), temperature source Axillary, resp. rate 16, height 5\' 3"  (1.6 m), weight 85.3 kg, last menstrual period 12/26/2017, SpO2 100 %, unknown if currently breastfeeding.  Physical Exam:  General: alert, cooperative and no distress Chest: no respiratory distress Heart: regular rate, distal pulses intact Abdomen: soft, nontender Uterine Fundus: firm, appropriately tender DVT Evaluation: No calf swelling or tenderness, negative Homan Extremities: no edema Skin: warm, dry; incision with honeycomb dressing, dried blood, intact, no erythema  Recent Labs (last 2 labs)       Recent Labs    09/26/18 2254 09/28/18 0616  HGB 10.8* 9.0*  HCT 32.8* 27.7*      Assessment/Plan: Erin Leonard is a 33 y.o. [id number] s/p SVD at [redacted]w[redacted]d and BTL.   PPD#1 SVD & POD#1BTL - Doing well Contraception: BTL Feeding: breast Dispo: Plan for discharge tomorrow 8/14.   LOS: 2 days   Orland Penman, PA student Ginette Otto, [place] 09/28/2018, 8:06 AM

## 2018-09-28 NOTE — H&P (Deleted)
POSTPARTUM PROGRESS NOTE  Post Partum Day 1  Subjective:  Erin Leonard is a 33 y.o. [id number] s/p VSD at [redacted]w[redacted]d and POD#1 BTL .  No acute events overnight.  Pt denies problems with ambulating, voiding or po intake.  She denies nausea or vomiting.  Pain is poorly controlled.  She has had flatus. She has not had bowel movement.  Lochia Moderate.  Incision area has dry blood but otherwise looks dry and intact.  Objective: Blood pressure 114/74, pulse 86, temperature 97.9 F (36.6 C), temperature source Axillary, resp. rate 16, height 5\' 3"  (1.6 m), weight 85.3 kg, last menstrual period 12/26/2017, SpO2 100 %, unknown if currently breastfeeding.  Physical Exam:  General: alert, cooperative and no distress Chest: no respiratory distress Heart: regular rate, distal pulses intact Abdomen: soft, nontender Uterine Fundus: firm, appropriately tender DVT Evaluation: No calf swelling or tenderness, negative Homan Extremities: no edema Skin: warm, dry; incision with honeycomb dressing, dried blood, intact, no erythema  Recent Labs    09/26/18 2254 09/28/18 0616  HGB 10.8* 9.0*  HCT 32.8* 27.7*    Assessment/Plan: Erin Leonard is a 33 y.o. B1D1761 s/p SVD at [redacted]w[redacted]d and BTL.   PPD#1 SVD & POD#1BTL - Doing well Contraception: BTL Feeding: breast Dispo: Plan for discharge tomorrow 8/14.   LOS: 2 days   Orland Penman, PA student Ginette Otto, [place] 09/28/2018, 8:06 AM

## 2018-09-28 NOTE — Progress Notes (Signed)
Post Partum Day 1,  --S/p SVD 08/12 at 0943 --S/p BTL 08/12 1725  Subjective: no complaints, up ad lib, voiding, tolerating PO and + flatus. Patient endorses pain score of 2 one hour after prescribed medication. Pain score of 5 just before next dose is due. Denies concerns.  Objective: Blood pressure 114/74, pulse 86, temperature 97.9 F (36.6 C), temperature source Axillary, resp. rate 16, height 5\' 3"  (1.6 m), weight 85.3 kg, last menstrual period 12/26/2017, SpO2 100 %, unknown if currently breastfeeding.  Physical Exam:  General: alert, cooperative, appears stated age and no distress Lochia: appropriate Uterine Fundus: firm Incision: no significant drainage, no dehiscence, no significant erythema. Intact honeycomb at umbilicus DVT Evaluation: No evidence of DVT seen on physical exam. Negative Homan's sign.  Recent Labs    09/26/18 2254 09/28/18 0616  HGB 10.8* 9.0*  HCT 32.8* 27.7*    Assessment/Plan: Plan for discharge tomorrow   LOS: 2 days   Darlina Rumpf, CNM 09/28/2018, 10:09 AM

## 2018-09-28 NOTE — Anesthesia Postprocedure Evaluation (Signed)
Anesthesia Post Note  Patient: Erin Leonard  Procedure(s) Performed: POST PARTUM TUBAL LIGATION (Bilateral )     Patient location during evaluation: PACU Anesthesia Type: Epidural Level of consciousness: awake and alert and oriented Pain management: pain level controlled Vital Signs Assessment: post-procedure vital signs reviewed and stable Respiratory status: spontaneous breathing, nonlabored ventilation and respiratory function stable Cardiovascular status: blood pressure returned to baseline and stable Postop Assessment: no headache, no backache, epidural receding, patient able to bend at knees and no apparent nausea or vomiting Anesthetic complications: no               Christalyn Goertz A.

## 2018-09-28 NOTE — Discharge Instructions (Signed)

## 2018-09-28 NOTE — Lactation Note (Signed)
This note was copied from a baby's chart. Lactation Consultation Note: Mother declines need for [place] visit. She reports that she doesn't have any breastfeeding concerns or questions and reports that infant is breastfeeding well.   Patient Name: Erin Leonard [id number] Date: 09/28/2018 Reason for consult: Follow-up assessment   Maternal Data    Feeding Feeding Type: Breast Fed  LATCH Score                   Interventions    Lactation Tools Discussed/Used     Consult Status Consult Status: Complete    Darla Lesches 09/28/2018, 10:51 AM

## 2018-09-29 MED ORDER — ACETAMINOPHEN 325 MG PO TABS: 650.0000 mg | ORAL_TABLET | ORAL | 0 refills | Status: DC | PRN

## 2018-09-29 MED ORDER — IBUPROFEN 600 MG PO TABS: 600.0000 mg | ORAL_TABLET | Freq: Four times a day (QID) | ORAL | 0 refills | Status: DC

## 2018-10-01 ENCOUNTER — Other Ambulatory Visit (HOSPITAL_COMMUNITY)
Admission: RE | Admit: 2018-10-01 | Discharge: 2018-10-01 | Disposition: A | Discharge status: Home / Self Care | Payer: Medicaid Other | Source: Ambulatory Visit

## 2018-10-03 ENCOUNTER — Inpatient Hospital Stay (HOSPITAL_COMMUNITY): Admission: AD | Admit: 2018-10-03 | Payer: Medicaid Other | Source: Home / Self Care | Admitting: Family Medicine

## 2018-10-03 ENCOUNTER — Inpatient Hospital Stay (HOSPITAL_COMMUNITY): Payer: Medicaid Other

## 2018-10-03 ENCOUNTER — Encounter: Payer: Medicaid Other | Admitting: Advanced Practice Midwife

## 2018-11-16 ENCOUNTER — Ambulatory Visit: Payer: Medicaid Other | Admitting: Women's Health

## 2019-04-30 ENCOUNTER — Other Ambulatory Visit: Payer: Self-pay

## 2019-04-30 ENCOUNTER — Ambulatory Visit (INDEPENDENT_AMBULATORY_CARE_PROVIDER_SITE_OTHER): Payer: Medicaid Other

## 2019-04-30 ENCOUNTER — Other Ambulatory Visit (HOSPITAL_COMMUNITY)
Admission: RE | Admit: 2019-04-30 | Discharge: 2019-04-30 | Disposition: A | Discharge status: Home / Self Care | Payer: Medicaid Other | Source: Ambulatory Visit | Attending: Obstetrics & Gynecology | Admitting: Obstetrics & Gynecology

## 2019-04-30 VITALS — BP 109/66 | HR 67 | Ht 63.0 in | Wt 173.0 lb

## 2019-04-30 DIAGNOSIS — N898 Other specified noninflammatory disorders of vagina: Secondary | ICD-10-CM | POA: Diagnosis present

## 2019-04-30 NOTE — Progress Notes (Addendum)
Pt states she has been having vaginal odor and discharge x 4 days. Pt requests STI screening and wet prep. Self swab was sent to the lab.  Laverne Klugh l Yamil Dougher, CMA   Attestation of Attending Supervision of RN: Evaluation and management procedures were performed by the nurse under my supervision and collaboration.  I have reviewed the nursing note and chart, and I agree with the management and plan.  Carolyn L. Harraway-Smith, M.D., [place]

## 2019-05-01 LAB — CERVICOVAGINAL ANCILLARY ONLY
Bacterial Vaginitis (gardnerella): POSITIVE — AB
Candida Glabrata: NEGATIVE
Candida Vaginitis: NEGATIVE
Chlamydia: NEGATIVE
Comment: NEGATIVE
Comment: NEGATIVE
Comment: NEGATIVE
Comment: NEGATIVE
Comment: NEGATIVE
Comment: NORMAL
Neisseria Gonorrhea: NEGATIVE
Trichomonas: NEGATIVE

## 2019-05-02 ENCOUNTER — Telehealth: Payer: Self-pay

## 2019-05-02 DIAGNOSIS — N760 Acute vaginitis: Secondary | ICD-10-CM

## 2019-05-02 DIAGNOSIS — B9689 Other specified bacterial agents as the cause of diseases classified elsewhere: Secondary | ICD-10-CM

## 2019-05-02 MED ORDER — METRONIDAZOLE 500 MG PO TABS: 500.0000 mg | ORAL_TABLET | Freq: Two times a day (BID) | ORAL | 0 refills | Status: DC

## 2019-05-02 NOTE — Telephone Encounter (Signed)
Called pt regarding positive BV results. Understanding was voiced. Flagyl was sent to the pharmacy. Tanith Dagostino l Renley Gutman, CMA

## 2019-05-03 ENCOUNTER — Telehealth: Payer: Self-pay

## 2019-05-03 DIAGNOSIS — B9689 Other specified bacterial agents as the cause of diseases classified elsewhere: Secondary | ICD-10-CM

## 2019-05-03 MED ORDER — METRONIDAZOLE 0.75 % VA GEL: 1.0000 | Freq: Every day | VAGINAL | 0 refills | Status: DC

## 2019-05-03 NOTE — Telephone Encounter (Signed)
Pt called the office stating she was given antibiotics at her dentist appointment. Pt wants to know is it ok to take her Flagyl with her other antibiotics and while she is on her period. Pt made aware that Metrogel gel will be sent to her pharmacy and she can use the Metrogel after her period. Understanding was voiced.  Jessup Ogas l Anival Pasha, CMA

## 2019-05-24 ENCOUNTER — Other Ambulatory Visit: Payer: Self-pay | Admitting: Obstetrics & Gynecology

## 2020-04-12 ENCOUNTER — Emergency Department (HOSPITAL_COMMUNITY): Payer: Medicaid Other

## 2020-04-12 ENCOUNTER — Emergency Department (HOSPITAL_COMMUNITY): Payer: Medicaid Other | Admitting: Anesthesiology

## 2020-04-12 ENCOUNTER — Encounter (HOSPITAL_COMMUNITY): Payer: Self-pay

## 2020-04-12 ENCOUNTER — Inpatient Hospital Stay (HOSPITAL_COMMUNITY)
Admission: EM | Admit: 2020-04-12 | Discharge: 2020-04-14 | DRG: 494 | Disposition: A | Discharge status: Home / Self Care | Payer: Medicaid Other | Attending: Orthopedic Surgery | Admitting: Orthopedic Surgery

## 2020-04-12 ENCOUNTER — Encounter (HOSPITAL_COMMUNITY)
Admission: EM | Disposition: A | Discharge status: Home / Self Care | Payer: Self-pay | Source: Home / Self Care | Attending: Orthopedic Surgery

## 2020-04-12 ENCOUNTER — Other Ambulatory Visit: Payer: Self-pay

## 2020-04-12 ENCOUNTER — Observation Stay (HOSPITAL_COMMUNITY): Payer: Medicaid Other

## 2020-04-12 DIAGNOSIS — S82301B Unspecified fracture of lower end of right tibia, initial encounter for open fracture type I or II: Secondary | ICD-10-CM

## 2020-04-12 DIAGNOSIS — Z9889 Other specified postprocedural states: Secondary | ICD-10-CM

## 2020-04-12 DIAGNOSIS — S82201B Unspecified fracture of shaft of right tibia, initial encounter for open fracture type I or II: Secondary | ICD-10-CM | POA: Diagnosis present

## 2020-04-12 DIAGNOSIS — F17210 Nicotine dependence, cigarettes, uncomplicated: Secondary | ICD-10-CM | POA: Diagnosis present

## 2020-04-12 DIAGNOSIS — S82401B Unspecified fracture of shaft of right fibula, initial encounter for open fracture type I or II: Secondary | ICD-10-CM | POA: Diagnosis present

## 2020-04-12 DIAGNOSIS — Y9351 Activity, roller skating (inline) and skateboarding: Secondary | ICD-10-CM

## 2020-04-12 DIAGNOSIS — S82831B Other fracture of upper and lower end of right fibula, initial encounter for open fracture type I or II: Secondary | ICD-10-CM | POA: Diagnosis present

## 2020-04-12 DIAGNOSIS — Y92331 Roller skating rink as the place of occurrence of the external cause: Secondary | ICD-10-CM

## 2020-04-12 DIAGNOSIS — Z4889 Encounter for other specified surgical aftercare: Secondary | ICD-10-CM

## 2020-04-12 DIAGNOSIS — Z419 Encounter for procedure for purposes other than remedying health state, unspecified: Secondary | ICD-10-CM

## 2020-04-12 DIAGNOSIS — S82251B Displaced comminuted fracture of shaft of right tibia, initial encounter for open fracture type I or II: Principal | ICD-10-CM | POA: Diagnosis present

## 2020-04-12 DIAGNOSIS — Z20822 Contact with and (suspected) exposure to covid-19: Secondary | ICD-10-CM | POA: Diagnosis present

## 2020-04-12 HISTORY — PX: TIBIA IM NAIL INSERTION: SHX2516

## 2020-04-12 LAB — RESP PANEL BY RT-PCR (FLU A&B, COVID) ARPGX2
Influenza A by PCR: NEGATIVE
Influenza B by PCR: NEGATIVE
SARS Coronavirus 2 by RT PCR: NEGATIVE

## 2020-04-12 SURGERY — INSERTION, INTRAMEDULLARY ROD, TIBIA
Anesthesia: General | Laterality: Right

## 2020-04-12 MED ORDER — OXYCODONE HCL 5 MG/5ML PO SOLN: 5.0000 mg | Freq: Once | ORAL | Status: DC | PRN

## 2020-04-12 MED ORDER — DEXAMETHASONE SODIUM PHOSPHATE 10 MG/ML IJ SOLN
INTRAMUSCULAR | Status: AC
  Filled 2020-04-12: qty 1

## 2020-04-12 MED ORDER — LIDOCAINE 2% (20 MG/ML) 5 ML SYRINGE
INTRAMUSCULAR | Status: AC
  Filled 2020-04-12: qty 5

## 2020-04-12 MED ORDER — FENTANYL CITRATE (PF) 100 MCG/2ML IJ SOLN
INTRAMUSCULAR | Status: AC
  Filled 2020-04-12: qty 2

## 2020-04-12 MED ORDER — ROCURONIUM BROMIDE 10 MG/ML (PF) SYRINGE
PREFILLED_SYRINGE | INTRAVENOUS | Status: AC
  Filled 2020-04-12: qty 10

## 2020-04-12 MED ORDER — ONDANSETRON HCL 4 MG PO TABS: 4.0000 mg | ORAL_TABLET | Freq: Four times a day (QID) | ORAL | Status: DC | PRN

## 2020-04-12 MED ORDER — ONDANSETRON HCL 4 MG/2ML IJ SOLN
4.0000 mg | Freq: Once | INTRAMUSCULAR | Status: AC
  Administered 2020-04-12: 4 mg via INTRAVENOUS
  Filled 2020-04-12: qty 2

## 2020-04-12 MED ORDER — ACETAMINOPHEN 10 MG/ML IV SOLN
INTRAVENOUS | Status: AC
  Administered 2020-04-12: 1000 mg via INTRAVENOUS
  Filled 2020-04-12: qty 100

## 2020-04-12 MED ORDER — PROPOFOL 10 MG/ML IV BOLUS
INTRAVENOUS | Status: AC
  Filled 2020-04-12: qty 20

## 2020-04-12 MED ORDER — ACETAMINOPHEN 10 MG/ML IV SOLN: 1000.0000 mg | Freq: Once | INTRAVENOUS | Status: DC | PRN

## 2020-04-12 MED ORDER — MIDAZOLAM HCL 2 MG/2ML IJ SOLN
INTRAMUSCULAR | Status: AC
  Filled 2020-04-12: qty 2

## 2020-04-12 MED ORDER — ACETAMINOPHEN 500 MG PO TABS
500.0000 mg | ORAL_TABLET | Freq: Four times a day (QID) | ORAL | Status: AC
  Administered 2020-04-13 (×2): 500 mg via ORAL
  Filled 2020-04-12 (×2): qty 1

## 2020-04-12 MED ORDER — ROCURONIUM BROMIDE 100 MG/10ML IV SOLN
INTRAVENOUS | Status: DC | PRN
  Administered 2020-04-12: 40 mg via INTRAVENOUS

## 2020-04-12 MED ORDER — ENOXAPARIN SODIUM 40 MG/0.4ML ~~LOC~~ SOLN
40.0000 mg | SUBCUTANEOUS | Status: DC
  Administered 2020-04-13 – 2020-04-14 (×2): 40 mg via SUBCUTANEOUS
  Filled 2020-04-12 (×2): qty 0.4

## 2020-04-12 MED ORDER — MORPHINE SULFATE (PF) 4 MG/ML IV SOLN
4.0000 mg | Freq: Once | INTRAVENOUS | Status: AC
  Administered 2020-04-12: 4 mg via INTRAVENOUS
  Filled 2020-04-12: qty 1

## 2020-04-12 MED ORDER — STERILE WATER FOR IRRIGATION IR SOLN
Status: DC | PRN
Start: 2020-04-12 — End: 2020-04-12
  Administered 2020-04-12: 1000 mL

## 2020-04-12 MED ORDER — METHOCARBAMOL 500 MG PO TABS: 500.0000 mg | ORAL_TABLET | Freq: Four times a day (QID) | ORAL | 1 refills | Status: DC | PRN

## 2020-04-12 MED ORDER — DEXAMETHASONE SODIUM PHOSPHATE 10 MG/ML IJ SOLN
INTRAMUSCULAR | Status: DC | PRN
  Administered 2020-04-12: 10 mg via INTRAVENOUS

## 2020-04-12 MED ORDER — PROPOFOL 10 MG/ML IV BOLUS
INTRAVENOUS | Status: DC | PRN
  Administered 2020-04-12: 150 mg via INTRAVENOUS

## 2020-04-12 MED ORDER — CEFAZOLIN SODIUM-DEXTROSE 2-3 GM-%(50ML) IV SOLR
INTRAVENOUS | Status: DC | PRN
  Administered 2020-04-12: 2 g via INTRAVENOUS

## 2020-04-12 MED ORDER — FENTANYL CITRATE (PF) 250 MCG/5ML IJ SOLN
INTRAMUSCULAR | Status: AC
  Filled 2020-04-12: qty 5

## 2020-04-12 MED ORDER — ONDANSETRON HCL 4 MG/2ML IJ SOLN: 4.0000 mg | Freq: Four times a day (QID) | INTRAMUSCULAR | Status: DC | PRN

## 2020-04-12 MED ORDER — LACTATED RINGERS IV SOLN: INTRAVENOUS | Status: DC | PRN

## 2020-04-12 MED ORDER — ONDANSETRON HCL 4 MG/2ML IJ SOLN
INTRAMUSCULAR | Status: DC | PRN
  Administered 2020-04-12: 4 mg via INTRAVENOUS

## 2020-04-12 MED ORDER — LIDOCAINE HCL (CARDIAC) PF 100 MG/5ML IV SOSY
PREFILLED_SYRINGE | INTRAVENOUS | Status: DC | PRN
  Administered 2020-04-12: 60 mg via INTRAVENOUS

## 2020-04-12 MED ORDER — OXYCODONE HCL 5 MG PO TABS: 5.0000 mg | ORAL_TABLET | Freq: Once | ORAL | Status: DC | PRN

## 2020-04-12 MED ORDER — ACETAMINOPHEN 325 MG PO TABS
325.0000 mg | ORAL_TABLET | Freq: Four times a day (QID) | ORAL | Status: DC | PRN
Start: 2020-04-13 — End: 2020-04-15

## 2020-04-12 MED ORDER — HYDROCODONE-ACETAMINOPHEN 7.5-325 MG PO TABS
1.0000 | ORAL_TABLET | ORAL | Status: DC | PRN
  Administered 2020-04-13: 1 via ORAL
  Filled 2020-04-12: qty 1

## 2020-04-12 MED ORDER — SUCCINYLCHOLINE CHLORIDE 200 MG/10ML IV SOSY
PREFILLED_SYRINGE | INTRAVENOUS | Status: AC
  Filled 2020-04-12: qty 10

## 2020-04-12 MED ORDER — FENTANYL CITRATE (PF) 250 MCG/5ML IJ SOLN
INTRAMUSCULAR | Status: DC | PRN
  Administered 2020-04-12: 50 ug via INTRAVENOUS
  Administered 2020-04-12: 150 ug via INTRAVENOUS
  Administered 2020-04-12: 50 ug via INTRAVENOUS

## 2020-04-12 MED ORDER — CLINDAMYCIN PHOSPHATE 900 MG/50ML IV SOLN
900.0000 mg | INTRAVENOUS | Status: AC
  Administered 2020-04-12: 900 mg via INTRAVENOUS
  Filled 2020-04-12: qty 50

## 2020-04-12 MED ORDER — KETOROLAC TROMETHAMINE 30 MG/ML IJ SOLN: 30.0000 mg | Freq: Once | INTRAMUSCULAR | Status: DC

## 2020-04-12 MED ORDER — CEFAZOLIN SODIUM 1 G IJ SOLR
INTRAMUSCULAR | Status: AC
  Filled 2020-04-12: qty 20

## 2020-04-12 MED ORDER — METOCLOPRAMIDE HCL 5 MG PO TABS
5.0000 mg | ORAL_TABLET | Freq: Three times a day (TID) | ORAL | Status: DC | PRN
Start: 2020-04-12 — End: 2020-04-15

## 2020-04-12 MED ORDER — HYDROCODONE-ACETAMINOPHEN 5-325 MG PO TABS
1.0000 | ORAL_TABLET | ORAL | Status: DC | PRN
  Administered 2020-04-13 – 2020-04-14 (×7): 2 via ORAL
  Filled 2020-04-12 (×7): qty 2

## 2020-04-12 MED ORDER — ONDANSETRON HCL 4 MG/2ML IJ SOLN
INTRAMUSCULAR | Status: AC
  Filled 2020-04-12: qty 2

## 2020-04-12 MED ORDER — CEFAZOLIN SODIUM-DEXTROSE 1-4 GM/50ML-% IV SOLN
1.0000 g | Freq: Four times a day (QID) | INTRAVENOUS | Status: AC
  Administered 2020-04-13 (×3): 1 g via INTRAVENOUS
  Filled 2020-04-12 (×3): qty 50

## 2020-04-12 MED ORDER — MIDAZOLAM HCL 2 MG/2ML IJ SOLN
INTRAMUSCULAR | Status: DC | PRN
  Administered 2020-04-12: 2 mg via INTRAVENOUS

## 2020-04-12 MED ORDER — OXYCODONE HCL 5 MG PO TABS: 5.0000 mg | ORAL_TABLET | Freq: Three times a day (TID) | ORAL | 0 refills | Status: DC | PRN

## 2020-04-12 MED ORDER — PHENYLEPHRINE 40 MCG/ML (10ML) SYRINGE FOR IV PUSH (FOR BLOOD PRESSURE SUPPORT)
PREFILLED_SYRINGE | INTRAVENOUS | Status: DC | PRN
  Administered 2020-04-12: 120 ug via INTRAVENOUS

## 2020-04-12 MED ORDER — SUGAMMADEX SODIUM 200 MG/2ML IV SOLN
INTRAVENOUS | Status: DC | PRN
  Administered 2020-04-12: 200 mg via INTRAVENOUS

## 2020-04-12 MED ORDER — METOCLOPRAMIDE HCL 5 MG/ML IJ SOLN: 5.0000 mg | Freq: Three times a day (TID) | INTRAMUSCULAR | Status: DC | PRN

## 2020-04-12 MED ORDER — 0.9 % SODIUM CHLORIDE (POUR BTL) OPTIME
TOPICAL | Status: DC | PRN
  Administered 2020-04-12 (×2): 1000 mL

## 2020-04-12 MED ORDER — DOCUSATE SODIUM 100 MG PO CAPS
100.0000 mg | ORAL_CAPSULE | Freq: Two times a day (BID) | ORAL | Status: DC
  Administered 2020-04-13 – 2020-04-14 (×3): 100 mg via ORAL
  Filled 2020-04-12 (×3): qty 1

## 2020-04-12 MED ORDER — ONDANSETRON 4 MG PO TBDP: 4.0000 mg | ORAL_TABLET | Freq: Three times a day (TID) | ORAL | 0 refills | Status: DC | PRN

## 2020-04-12 MED ORDER — MORPHINE SULFATE (PF) 2 MG/ML IV SOLN
0.5000 mg | INTRAVENOUS | Status: DC | PRN
  Administered 2020-04-13 – 2020-04-14 (×4): 1 mg via INTRAVENOUS
  Filled 2020-04-12 (×4): qty 1

## 2020-04-12 MED ORDER — FENTANYL CITRATE (PF) 100 MCG/2ML IJ SOLN
25.0000 ug | INTRAMUSCULAR | Status: DC | PRN
Start: 2020-04-12 — End: 2020-04-12
  Administered 2020-04-12 (×2): 50 ug via INTRAVENOUS

## 2020-04-12 MED ORDER — PROMETHAZINE HCL 25 MG/ML IJ SOLN: 6.2500 mg | INTRAMUSCULAR | Status: DC | PRN

## 2020-04-12 MED ORDER — SENNOSIDES-DOCUSATE SODIUM 8.6-50 MG PO TABS: 1.0000 | ORAL_TABLET | Freq: Every evening | ORAL | Status: DC | PRN

## 2020-04-12 SURGICAL SUPPLY — 60 items
ALCOHOL 70% 16 OZ (MISCELLANEOUS) ×2 IMPLANT
BANDAGE ESMARK 6X9 LF (GAUZE/BANDAGES/DRESSINGS) IMPLANT
BIT DRILL FLUTED FEMUR 4.2/3 (BIT) ×2 IMPLANT
BIT DRILL SHORT 4.2 (BIT) ×2 IMPLANT
BNDG ELASTIC 4X5.8 VLCR STR LF (GAUZE/BANDAGES/DRESSINGS) ×2 IMPLANT
BNDG ELASTIC 6X5.8 VLCR STR LF (GAUZE/BANDAGES/DRESSINGS) ×2 IMPLANT
BNDG ESMARK 6X9 LF (GAUZE/BANDAGES/DRESSINGS)
COVER MAYO STAND STRL (DRAPES) ×2 IMPLANT
COVER SURGICAL LIGHT HANDLE (MISCELLANEOUS) ×2 IMPLANT
COVER WAND RF STERILE (DRAPES) IMPLANT
CUFF TOURN SGL QUICK 34 (TOURNIQUET CUFF)
CUFF TRNQT CYL 34X4.125X (TOURNIQUET CUFF) IMPLANT
DRAPE C-ARM 42X72 X-RAY (DRAPES) ×2 IMPLANT
DRAPE C-ARMOR (DRAPES) ×2 IMPLANT
DRAPE HALF SHEET 40X57 (DRAPES) ×2 IMPLANT
DRAPE IMP U-DRAPE 54X76 (DRAPES) ×2 IMPLANT
DRAPE POUCH INSTRU U-SHP 10X18 (DRAPES) ×2 IMPLANT
DRAPE U-SHAPE 47X51 STRL (DRAPES) ×2 IMPLANT
DRAPE UTILITY XL STRL (DRAPES) ×4 IMPLANT
DRILL BIT SHORT 4.2 (BIT) ×4
DRSG ADAPTIC 3X8 NADH LF (GAUZE/BANDAGES/DRESSINGS) ×2 IMPLANT
DRSG PAD ABDOMINAL 8X10 ST (GAUZE/BANDAGES/DRESSINGS) ×4 IMPLANT
DURAPREP 26ML APPLICATOR (WOUND CARE) IMPLANT
ELECT CAUTERY BLADE 6.4 (BLADE) ×2 IMPLANT
ELECT REM PT RETURN 9FT ADLT (ELECTROSURGICAL) ×2
ELECTRODE REM PT RTRN 9FT ADLT (ELECTROSURGICAL) ×1 IMPLANT
FACESHIELD WRAPAROUND (MASK) IMPLANT
GAUZE SPONGE 4X4 12PLY STRL (GAUZE/BANDAGES/DRESSINGS) ×2 IMPLANT
GAUZE XEROFORM 1X8 LF (GAUZE/BANDAGES/DRESSINGS) IMPLANT
GLOVE BIO SURGEON STRL SZ7.5 (GLOVE) ×4 IMPLANT
GLOVE SRG 8 PF TXTR STRL LF DI (GLOVE) ×2 IMPLANT
GLOVE SURG UNDER POLY LF SZ8 (GLOVE) ×2
GOWN STRL REUS W/ TWL LRG LVL3 (GOWN DISPOSABLE) ×1 IMPLANT
GOWN STRL REUS W/ TWL XL LVL3 (GOWN DISPOSABLE) ×2 IMPLANT
GOWN STRL REUS W/TWL LRG LVL3 (GOWN DISPOSABLE) ×2
GOWN STRL REUS W/TWL XL LVL3 (GOWN DISPOSABLE) ×2
GUIDEWIRE 3.2X400 (WIRE) ×2 IMPLANT
KIT BASIN OR (CUSTOM PROCEDURE TRAY) ×2 IMPLANT
MANIFOLD NEPTUNE II (INSTRUMENTS) IMPLANT
NAIL TIB TFNA 9X330 (Nail) ×2 IMPLANT
NS IRRIG 1000ML POUR BTL (IV SOLUTION) ×2 IMPLANT
PACK TOTAL JOINT (CUSTOM PROCEDURE TRAY) ×2 IMPLANT
PAD CAST 4YDX4 CTTN HI CHSV (CAST SUPPLIES) ×1 IMPLANT
PADDING CAST COTTON 4X4 STRL (CAST SUPPLIES) ×1
PADDING CAST COTTON 6X4 STRL (CAST SUPPLIES) ×2 IMPLANT
REAMER ROD DEEP FLUTE 2.5X950 (INSTRUMENTS) ×2 IMPLANT
SCREW LOCK HDLS 5X32 (Screw) ×2 IMPLANT
SCREW LOCK HDLS 5X40 STRL (Screw) ×2 IMPLANT
SCREW LOCK IM NAIL 5X34 (Screw) ×4 IMPLANT
SLEEVE PROTECTIVE FLEX NL 8-13 (ORTHOPEDIC DISPOSABLE SUPPLIES) ×2 IMPLANT
STAPLER SKIN PROX WIDE 3.9 (STAPLE) IMPLANT
STAPLER VISISTAT 35W (STAPLE) ×2 IMPLANT
SUT MON AB 2-0 CT1 36 (SUTURE) ×2 IMPLANT
SUT VIC AB 0 CT1 27 (SUTURE) ×1
SUT VIC AB 0 CT1 27XBRD ANBCTR (SUTURE) ×1 IMPLANT
SUT VIC AB 2-0 CT1 36 (SUTURE) ×2 IMPLANT
SUT VIC AB 3-0 PS2 18 (SUTURE) ×2 IMPLANT
TOWEL GREEN STERILE (TOWEL DISPOSABLE) ×4 IMPLANT
TROCAR LONG FOR NAIL 8-13 SILE (TROCAR) ×2 IMPLANT
WATER STERILE IRR 1000ML POUR (IV SOLUTION) ×2 IMPLANT

## 2020-04-12 NOTE — ED Triage Notes (Signed)
Pt arrived to ED via ems Pt was at daughter's birthday party roller skating. Pt went to step down and fell. Pt with obvious open fx to RLE. Extremity was splinted, + pedal pulse and bleeding controlled. Denies any other injury

## 2020-04-12 NOTE — Progress Notes (Signed)
Patient arrived on the floor from PACU at 2330 via bed. Vital signs taken and admission assessment completed to the best of my ability. Will review post op orders and proceed with POC.

## 2020-04-12 NOTE — H&P (Signed)
ORTHOPAEDIC H&P  REQUESTING PHYSICIAN: Jacalyn Lefevre, MD  PCP:  Patient, No Pcp Per  Chief Complaint:  Right leg pain  HPI: Erin Leonard is a 35 y.o. female who complains of right leg pain and deformity after a rollerskating accident earlier today.  She does live independently and has some small children.  She does smoke about a half a pack of cigarettes per day.  She denies any history of medical comorbidity or diabetes.  She currently was evaluated in the emergency department where a type I open distal tibia fracture of the right with proximal fibular fracture also the right was noted.  Orthopedic surgery consulted for surgical management.  Past Medical History:  Diagnosis Date  . Chronic kidney disease    kidney stone in 2015   Past Surgical History:  Procedure Laterality Date  . BREAST SURGERY    . TUBAL LIGATION Bilateral 09/27/2018   Procedure: POST PARTUM TUBAL LIGATION;  Surgeon: Lazaro Arms, MD;  Location: [facility] LD ORS;  Service: Gynecology;  Laterality: Bilateral;  . WISDOM TOOTH EXTRACTION     Social History   Socioeconomic History  . Marital status: Single    Spouse name: Not on file  . Number of children: Not on file  . Years of education: Not on file  . Highest education level: Not on file  Occupational History  . Not on file  Tobacco Use  . Smoking status: Former Smoker    Types: Cigarettes    Quit date: 10/12/2013    Years since quitting: 6.5  . Smokeless tobacco: Never Used  Vaping Use  . Vaping Use: Never used  Substance and Sexual Activity  . Alcohol use: No  . Drug use: No  . Sexual activity: Yes    Birth control/protection: None  Other Topics Concern  . Not on file  Social History Narrative  . Not on file   Social Determinants of Health   Financial Resource Strain: Not on file  Food Insecurity: Not on file  Transportation Needs: Not on file  Physical Activity: Not on file  Stress: Not on file  Social Connections: Not on file    Family History  Problem Relation Age of Onset  . Hypertension Father   . Diabetes Father    Allergies  Allergen Reactions  . Penicillins Hives and Rash  . Vancomycin Rash   Prior to Admission medications   Medication Sig Start Date End Date Taking? Authorizing Provider  ibuprofen (ADVIL) 600 MG tablet Take 1 tablet (600 mg total) by mouth every 6 (six) hours. Patient taking differently: Take 600 mg by mouth daily as needed for mild pain, headache or moderate pain. 09/29/18  Yes Autry-Lott, Randa Evens, DO  acetaminophen (TYLENOL) 325 MG tablet Take 2 tablets (650 mg total) by mouth every 4 (four) hours as needed (for pain scale < 4). Patient not taking: No sig reported 09/29/18   Autry-Lott, Randa Evens, DO  metroNIDAZOLE (FLAGYL) 500 MG tablet Take 1 tablet (500 mg total) by mouth 2 (two) times daily. Patient not taking: Reported on 04/12/2020 05/02/19   Willodean Rosenthal, MD  metroNIDAZOLE (METROGEL VAGINAL) 0.75 % vaginal gel Place 1 Applicatorful vaginally at bedtime. Patient not taking: Reported on 04/12/2020 05/03/19   [name], DO  Prenatal Vit-Fe Fumarate-FA (PRENATAL VITAMINS) 28-0.8 MG TABS Take 1 tablet by mouth daily. Patient not taking: No sig reported 01/30/18   Jacalyn Lefevre, MD   DG Tibia/Fibula Right  Result Date: 04/12/2020 CLINICAL DATA:  Status post fall.  EXAM: RIGHT TIBIA AND FIBULA - 2 VIEW COMPARISON:  None. FINDINGS: There is a comminuted oblique fracture of the proximal fibula. There is an associated comminuted oblique displaced fracture of the distal tibia associated soft tissue swelling and hematoma at the distal fracture site. IMPRESSION: 1. Comminuted oblique fracture of the proximal fibula. 2. Comminuted oblique displaced fracture of the distal tibia. Electronically Signed   By: Ted Mcalpine M.D.   On: 04/12/2020 17:46    Positive ROS: All other systems have been reviewed and were otherwise negative with the exception of those mentioned in the HPI  and as above.  Physical Exam: General: Alert, no acute distress Cardiovascular: No pedal edema Respiratory: No cyanosis, no use of accessory musculature GI: No organomegaly, abdomen is soft and non-tender Skin: No lesions in the area of chief complaint Neurologic: Sensation intact distally Psychiatric: Patient is competent for consent with normal mood and affect Lymphatic: No axillary or cervical lymphadenopathy  MUSCULOSKELETAL:  Right lower extremity:  She is a punctate wound on the medial aspect of the tibia distal third.  Some bleeding noted but no gross contamination.  Distally neurovascularly intact.  No pain at the knee or hip or thigh.  Assessment: 1.  Type I open right distal tibia fracture 2.  Right closed fibula fracture  Plan: -Plan will be for emergent irrigation debridement with intramedullary nail for definitive fixation of the right open tibia fracture.  -We will admit her postoperatively for routine pain control as well as physical therapy for disposition planning.  - The risks, benefits, and alternatives were discussed with the patient. There are risks associated with the surgery including, but not limited to, problems with anesthesia (death), infection, differences in leg length/angulation/rotation, fracture of bones, loosening or failure of implants, malunion, nonunion, hematoma (blood accumulation) which may require surgical drainage, blood clots, pulmonary embolism, nerve injury (foot drop), and blood vessel injury. The patient understands these risks and elects to proceed.     Yolonda Kida, MD Cell [phone]    04/12/2020 8:57 PM

## 2020-04-12 NOTE — Transfer of Care (Signed)
Immediate Anesthesia Transfer of Care Note  Patient: Erin Leonard  Procedure(s) Performed: INTRAMEDULLARY (IM) NAIL TIBIAL, irrigation and debridement right lower leg (Right )  Patient Location: PACU  Anesthesia Type:General  Level of Consciousness: awake, alert  and oriented  Airway & Oxygen Therapy: Patient Spontanous Breathing  Post-op Assessment: Report given to RN and Post -op Vital signs reviewed and stable  Post vital signs: Reviewed and stable  Last Vitals:  Vitals Value Taken Time  BP    Temp    Pulse 94 04/12/20 2248  Resp 14 04/12/20 2248  SpO2 100 % 04/12/20 2248  Vitals shown include unvalidated device data.  Last Pain:  Vitals:   04/12/20 2024  TempSrc: Oral  PainSc:          Complications: No complications documented.

## 2020-04-12 NOTE — Discharge Instructions (Signed)
-  Maintain postoperative bandages for 3 days.  On the third day you may remove and discard the bandages.  You should replace with daily dry dressings and an Ace wrap.  You should expect moderate bloody drainage at the first dressing change.  -You should wear the fracture boot while sleeping and throughout the day.  You may remove this boot to shower and get dressed.  Should also wear the boot while weightbearing.  -You should try to elevate your right lower extremity with your "toes above nose."  Do this is much as possible throughout the day.  -Should apply ice to the right knee and right ankle throughout the day for 20 to 30 minutes/h.  -For mild to moderate pain use Tylenol and Advil every 6 hours independent and alternating around-the-clock.  For breakthrough pain use oxycodone as necessary.  -Return to see Dr. Aundria Rud in the office in 2 weeks for routine postoperative care.

## 2020-04-12 NOTE — ED Provider Notes (Signed)
[facility] EMERGENCY DEPARTMENT Provider Note   CSN: [id number] Arrival date & time: 04/12/20  1700     History Chief Complaint  Patient presents with  . Leg Injury    Erin Leonard is a 35 y.o. female.  Pt presents to the ED today with right leg pain.  Pt said she was at her daughter's 9th birthday party at a roller skating rink.  She was skating and fell.  She has a deformity to her RLE and has a lac in the area of the likely fx.  Pt denies any other injury.  Pt given 150 mcg fentanyl en route.        Past Medical History:  Diagnosis Date  . Chronic kidney disease    kidney stone in 2015    Patient Active Problem List   Diagnosis Date Noted  . Request for sterilization 09/26/2018  . Labor and delivery indication for care or intervention 09/26/2018  . Calculus of kidney 05/02/2018  . Recurrent urinary tract infection 05/02/2018  . GBS bacteriuria 03/13/2018  . Supervision of other normal pregnancy, antepartum 03/09/2018    Past Surgical History:  Procedure Laterality Date  . BREAST SURGERY    . TUBAL LIGATION Bilateral 09/27/2018   Procedure: POST PARTUM TUBAL LIGATION;  Surgeon: Lazaro Arms, MD;  Location: [facility] LD ORS;  Service: Gynecology;  Laterality: Bilateral;  . WISDOM TOOTH EXTRACTION       OB History    Gravida  5   Para  5   Term  5   Preterm  0   AB  0   Living  5     SAB  0   IAB  0   Ectopic  0   Multiple  0   Live Births  5           Family History  Problem Relation Age of Onset  . Hypertension Father   . Diabetes Father     Social History   Tobacco Use  . Smoking status: Former Smoker    Types: Cigarettes    Quit date: 10/12/2013    Years since quitting: 6.5  . Smokeless tobacco: Never Used  Vaping Use  . Vaping Use: Never used  Substance Use Topics  . Alcohol use: No  . Drug use: No    Home Medications Prior to Admission medications   Medication Sig Start Date End Date Taking?  Authorizing Provider  ibuprofen (ADVIL) 600 MG tablet Take 1 tablet (600 mg total) by mouth every 6 (six) hours. Patient taking differently: Take 600 mg by mouth daily as needed for mild pain, headache or moderate pain. 09/29/18  Yes Autry-Lott, Randa Evens, DO  acetaminophen (TYLENOL) 325 MG tablet Take 2 tablets (650 mg total) by mouth every 4 (four) hours as needed (for pain scale < 4). Patient not taking: No sig reported 09/29/18   Autry-Lott, Randa Evens, DO  metroNIDAZOLE (FLAGYL) 500 MG tablet Take 1 tablet (500 mg total) by mouth 2 (two) times daily. Patient not taking: Reported on 04/12/2020 05/02/19   Willodean Rosenthal, MD  metroNIDAZOLE (METROGEL VAGINAL) 0.75 % vaginal gel Place 1 Applicatorful vaginally at bedtime. Patient not taking: Reported on 04/12/2020 05/03/19   [name], DO  Prenatal Vit-Fe Fumarate-FA (PRENATAL VITAMINS) 28-0.8 MG TABS Take 1 tablet by mouth daily. Patient not taking: No sig reported 01/30/18   Jacalyn Lefevre, MD    Allergies    Penicillins and Vancomycin  Review of Systems   Review  of Systems  Musculoskeletal:       Right lower leg pain  All other systems reviewed and are negative.   Physical Exam Updated Vital Signs BP 104/67   Pulse 67   Temp 99.2 F (37.3 C) (Oral)   Resp 13   Ht 5\' 3"  (1.6 m)   Wt 77.1 kg   LMP 03/17/2020   SpO2 99%   BMI 30.11 kg/m   Physical Exam Vitals and nursing note reviewed.  Constitutional:      Appearance: Normal appearance.  HENT:     Head: Normocephalic and atraumatic.     Right Ear: External ear normal.     Left Ear: External ear normal.     Nose: Nose normal.     Mouth/Throat:     Mouth: Mucous membranes are moist.     Pharynx: Oropharynx is clear.  Eyes:     Extraocular Movements: Extraocular movements intact.     Conjunctiva/sclera: Conjunctivae normal.     Pupils: Pupils are equal, round, and reactive to light.  Cardiovascular:     Rate and Rhythm: Normal rate and regular rhythm.      Pulses: Normal pulses.     Heart sounds: Normal heart sounds.  Pulmonary:     Effort: Pulmonary effort is normal.     Breath sounds: Normal breath sounds.  Abdominal:     General: Abdomen is flat. Bowel sounds are normal.     Palpations: Abdomen is soft.  Musculoskeletal:     Cervical back: Normal range of motion.     Comments: Open fx distal tibia.  Pulses intact.  Compartments soft.  Skin:    General: Skin is warm.     Capillary Refill: Capillary refill takes less than 2 seconds.  Neurological:     General: No focal deficit present.     Mental Status: She is alert and oriented to person, place, and time.  Psychiatric:        Mood and Affect: Mood normal.        Behavior: Behavior normal.        Thought Content: Thought content normal.        Judgment: Judgment normal.     ED Results / Procedures / Treatments   Labs (all labs ordered are listed, but only abnormal results are displayed) Labs Reviewed  RESP PANEL BY RT-PCR (FLU A&B, COVID) [id number]    EKG None  Radiology DG Tibia/Fibula Right  Result Date: 04/12/2020 CLINICAL DATA:  Status post fall. EXAM: RIGHT TIBIA AND FIBULA - 2 VIEW COMPARISON:  None. FINDINGS: There is a comminuted oblique fracture of the proximal fibula. There is an associated comminuted oblique displaced fracture of the distal tibia associated soft tissue swelling and hematoma at the distal fracture site. IMPRESSION: 1. Comminuted oblique fracture of the proximal fibula. 2. Comminuted oblique displaced fracture of the distal tibia. Electronically Signed   By: 04/14/2020 M.D.   On: 04/12/2020 17:46    Procedures Procedures   Medications Ordered in ED Medications  clindamycin (CLEOCIN) IVPB 900 mg (0 mg Intravenous Stopped 04/12/20 1829)  morphine 4 MG/ML injection 4 mg (4 mg Intravenous Given 04/12/20 1757)  ondansetron (ZOFRAN) injection 4 mg (4 mg Intravenous Given 04/12/20 1758)  morphine 4 MG/ML injection 4 mg (4 mg Intravenous Given  04/12/20 1957)    ED Course  I have reviewed the triage vital signs and the nursing notes.  Pertinent labs & imaging results that were available during my care of the  patient were reviewed by me and considered in my medical decision making (see chart for details).    MDM Rules/Calculators/A&P                          Pt does have an open tibia fracture.  She is pcn allergic, so she is given clindamycin.  Pt said her tetanus is UTD.  Pt is not vaccinated against Covid.  Covid swab has been sent and is negative.  Pt d/w Dr. Aundria Rud who will take pt to the OR.   Final Clinical Impression(s) / ED Diagnoses Final diagnoses:  Type I or II open fracture of distal end of right tibia, unspecified fracture morphology, initial encounter    Rx / DC Orders ED Discharge Orders    None       Jacalyn Lefevre, MD 04/12/20 2042

## 2020-04-12 NOTE — ED Notes (Signed)
Patient transported to X-ray 

## 2020-04-12 NOTE — Anesthesia Procedure Notes (Signed)
Procedure Name: Intubation Date/Time: 04/12/2020 9:03 PM Performed by: Molli Hazard, CRNA Pre-anesthesia Checklist: Patient identified, Emergency Drugs available, Suction available and Patient being monitored Patient Re-evaluated:Patient Re-evaluated prior to induction Oxygen Delivery Method: Circle system utilized Preoxygenation: Pre-oxygenation with 100% oxygen Induction Type: IV induction Ventilation: Mask ventilation without difficulty Laryngoscope Size: Miller and 2 Grade View: Grade I Tube type: Oral Tube size: 7.5 mm Number of attempts: 1 Airway Equipment and Method: Stylet Placement Confirmation: ETT inserted through vocal cords under direct vision,  positive ETCO2 and breath sounds checked- equal and bilateral Secured at: 22 cm Tube secured with: Tape Dental Injury: Teeth and Oropharynx as per pre-operative assessment

## 2020-04-12 NOTE — Brief Op Note (Signed)
04/12/2020  10:20 PM  PATIENT:  [name]  35 y.o. female  PRE-OPERATIVE DIAGNOSIS:   1.  Type I open right distal tibia fracture 2.  Right proximal fibula fracture, closed.  POST-OPERATIVE DIAGNOSIS:  * No post-op diagnosis entered *  PROCEDURE:  Procedure(s): 1.  Excisional irrigation and debridement for open fracture of right tibia including skin, subcutaneous tissue, bone, and muscle for type I injury. 2.  Intramedullary nail of right tibia 3.  Closed treatment with manipulation of right fibula fracture  SURGEON:  Surgeon(s) and Role:    * Aundria Rud, Noah Delaine, MD - Primary  PHYSICIAN ASSISTANT: Dion Saucier, PA-C  ANESTHESIA:   general  EBL:  100 mL   BLOOD ADMINISTERED:none  DRAINS: none   LOCAL MEDICATIONS USED:  NONE  SPECIMEN:  No Specimen  DISPOSITION OF SPECIMEN:  N/A  COUNTS:  YES  TOURNIQUET:  * Missing tourniquet times found for documented tourniquets in log: [id number] *  DICTATION: .Note written in EPIC  PLAN OF CARE: Admit to inpatient   PATIENT DISPOSITION:  PACU - hemodynamically stable.   Delay start of Pharmacological VTE agent (>24hrs) due to surgical blood loss or risk of bleeding: not applicable
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
## 2020-04-12 NOTE — Anesthesia Preprocedure Evaluation (Signed)
Anesthesia Evaluation  Patient identified by MRN, date of birth, ID band Patient awake    Reviewed: Allergy & Precautions, NPO status , Patient's Chart, lab work & pertinent test results  Airway Mallampati: II  TM Distance: >3 FB Neck ROM: Full    Dental no notable dental hx.    Pulmonary former smoker,    Pulmonary exam normal breath sounds clear to auscultation       Cardiovascular negative cardio ROS Normal cardiovascular exam Rhythm:Regular Rate:Normal     Neuro/Psych negative neurological ROS  negative psych ROS   GI/Hepatic negative GI ROS, Neg liver ROS,   Endo/Other  negative endocrine ROS  Renal/GU Renal disease     Musculoskeletal negative musculoskeletal ROS (+)   Abdominal   Peds  Hematology negative hematology ROS (+)   Anesthesia Other Findings open tibia fracture  Reproductive/Obstetrics S/p TUBAL LIGATION                             Anesthesia Physical Anesthesia Plan  ASA: II  Anesthesia Plan: General   Post-op Pain Management:    Induction: Intravenous  PONV Risk Score and Plan: 3 and Ondansetron, Dexamethasone, Midazolam and Treatment may vary due to age or medical condition  Airway Management Planned: Oral ETT  Additional Equipment:   Intra-op Plan:   Post-operative Plan: Extubation in OR  Informed Consent: I have reviewed the patients History and Physical, chart, labs and discussed the procedure including the risks, benefits and alternatives for the proposed anesthesia with the patient or authorized representative who has indicated his/her understanding and acceptance.     Dental advisory given  Plan Discussed with: CRNA  Anesthesia Plan Comments:         Anesthesia Quick Evaluation

## 2020-04-13 ENCOUNTER — Observation Stay (HOSPITAL_COMMUNITY): Payer: Medicaid Other

## 2020-04-13 DIAGNOSIS — Y9351 Activity, roller skating (inline) and skateboarding: Secondary | ICD-10-CM | POA: Diagnosis not present

## 2020-04-13 DIAGNOSIS — Y92331 Roller skating rink as the place of occurrence of the external cause: Secondary | ICD-10-CM | POA: Diagnosis not present

## 2020-04-13 DIAGNOSIS — S82251B Displaced comminuted fracture of shaft of right tibia, initial encounter for open fracture type I or II: Secondary | ICD-10-CM | POA: Diagnosis present

## 2020-04-13 DIAGNOSIS — S82831B Other fracture of upper and lower end of right fibula, initial encounter for open fracture type I or II: Secondary | ICD-10-CM | POA: Diagnosis present

## 2020-04-13 DIAGNOSIS — F17210 Nicotine dependence, cigarettes, uncomplicated: Secondary | ICD-10-CM | POA: Diagnosis present

## 2020-04-13 DIAGNOSIS — Z20822 Contact with and (suspected) exposure to covid-19: Secondary | ICD-10-CM | POA: Diagnosis present

## 2020-04-13 LAB — CBC
HCT: 36.4 % (ref 36.0–46.0)
Hemoglobin: 11.9 g/dL — ABNORMAL LOW (ref 12.0–15.0)
MCH: 30.3 pg (ref 26.0–34.0)
MCHC: 32.7 g/dL (ref 30.0–36.0)
MCV: 92.6 fL (ref 80.0–100.0)
Platelets: 264 10*3/uL (ref 150–400)
RBC: 3.93 MIL/uL (ref 3.87–5.11)
RDW: 17 % — ABNORMAL HIGH (ref 11.5–15.5)
WBC: 14.9 10*3/uL — ABNORMAL HIGH (ref 4.0–10.5)
nRBC: 0 % (ref 0.0–0.2)

## 2020-04-13 LAB — CREATININE, SERUM
Creatinine, Ser: 0.97 mg/dL (ref 0.44–1.00)
GFR, Estimated: >60 mL/min (ref >60)

## 2020-04-13 MED ORDER — CEFAZOLIN SODIUM-DEXTROSE 2-4 GM/100ML-% IV SOLN
2.0000 g | INTRAVENOUS | Status: AC
  Filled 2020-04-13: qty 100

## 2020-04-13 MED ORDER — POVIDONE-IODINE 10 % EX SWAB: 2.0000 "application " | Freq: Once | CUTANEOUS | Status: DC

## 2020-04-13 MED ORDER — CHLORHEXIDINE GLUCONATE 4 % EX LIQD: 60.0000 mL | Freq: Once | CUTANEOUS | Status: DC

## 2020-04-13 NOTE — Evaluation (Signed)
Physical Therapy Evaluation Patient Details Name: Erin Leonard MRN: [id number] DOB: 1985/04/22 Today's Date: 04/13/2020   History of Present Illness  Erin Leonard is a 35 y.o. female who complains of right leg pain and deformity after a rollerskating accident earlier today, sustaining a type I open distal tibia fracture of the right with proximal fibular fracture also the right was noted. S/p surgical fixation, 50% PWB in cam boot; She does live independently and has some small children.  Clinical Impression   Patient is s/p above surgery resulting in functional limitations due to the deficits listed below (see PT Problem List). Independent prior to this injury; plans to go home to her mother's home, which has a few steps to enter; Presents to PT with some difficulty standing up and walking; worked on using the RW to help maintain PWB with amb; Overall moving well; seems a bit overwhelmed with current situation; Still, I anticipate good progress; On track to dc home tomorrow;  Patient will benefit from skilled PT to increase their independence and safety with mobility to allow discharge to the venue listed below.       Follow Up Recommendations Outpatient PT (The potential need for Outpatient PT can be addressed at Ortho follow-up appointments. )    Equipment Recommendations  Rolling walker with 5" wheels;3in1 (PT)    Recommendations for Other Services       Precautions / Restrictions Precautions Required Braces or Orthoses: Other Brace Other Brace: Cam boot RLE Restrictions Weight Bearing Restrictions: Yes RLE Weight Bearing: Partial weight bearing RLE Partial Weight Bearing Percentage or Pounds: 50      Mobility  Bed Mobility Overal bed mobility: Needs Assistance Bed Mobility: Supine to Sit     Supine to sit: Supervision          Transfers Overall transfer level: Needs assistance Equipment used: Rolling walker (2 wheeled) Transfers: Sit to/from Stand Sit to  Stand: Min guard         General transfer comment: Cues for hand placement and safety; good maintenance of PWB with transitions  Ambulation/Gait Ambulation/Gait assistance: Min guard Gait Distance (Feet): 20 Feet Assistive device: Rolling walker (2 wheeled) Gait Pattern/deviations: Step-through pattern     General Gait Details: cues for gait sequence and how to use RW to keep PWB  Stairs Stairs: Yes Stairs assistance: Min assist Stair Management: Step to pattern;Backwards;With walker Number of Stairs: 2 (x2) General stair comments: demo of technique, and step by step cues; practiced twice  Wheelchair Mobility    Modified Rankin (Stroke Patients Only)       Balance                                             Pertinent Vitals/Pain Pain Assessment: Faces Faces Pain Scale: Hurts little more Pain Location: R lower leg Pain Descriptors / Indicators: Aching Pain Intervention(s): Monitored during session;Premedicated before session;Repositioned    Home Living Family/patient expects to be discharged to:: Private residence Living Arrangements: Children;Parent (plans to go to mother's home) Available Help at Discharge: Family Type of Home: House Home Access: Stairs to enter Entrance Stairs-Rails: None Entrance Stairs-Number of Steps: 2 Home Layout: One level Home Equipment: None      Prior Function Level of Independence: Independent               Hand Dominance  Extremity/Trunk Assessment   Upper Extremity Assessment Upper Extremity Assessment: Overall WFL for tasks assessed    Lower Extremity Assessment Lower Extremity Assessment: RLE deficits/detail RLE Deficits / Details: hip, knee WFL; painful ankle with attempts at AROM; sensation intact to light touch at toes; +active toe wiggle       Communication   Communication: No difficulties  Cognition Arousal/Alertness: Awake/alert Behavior During Therapy: WFL for tasks  assessed/performed Overall Cognitive Status: Within Functional Limits for tasks assessed                                 General Comments: tearful at times      General Comments General comments (skin integrity, edema, etc.): Tearful at times    Exercises     Assessment/Plan    PT Assessment Patient needs continued PT services  PT Problem List Decreased strength;Decreased range of motion;Decreased activity tolerance;Decreased mobility;Decreased knowledge of use of DME;Decreased knowledge of precautions       PT Treatment Interventions DME instruction;Gait training;Stair training;Functional mobility training;Therapeutic activities;Balance training;Therapeutic exercise;Patient/family education    PT Goals (Current goals can be found in the Care Plan section)  Acute Rehab PT Goals Patient Stated Goal: Hopes to be home tomorrow PT Goal Formulation: With patient Time For Goal Achievement: 04/27/20 Potential to Achieve Goals: Good    Frequency Min 6X/week   Barriers to discharge        Co-evaluation               AM-PAC PT "6 Clicks" Mobility  Outcome Measure Help needed turning from your back to your side while in a flat bed without using bedrails?: None Help needed moving from lying on your back to sitting on the side of a flat bed without using bedrails?: None Help needed moving to and from a bed to a chair (including a wheelchair)?: A Little Help needed standing up from a chair using your arms (e.g., wheelchair or bedside chair)?: A Little Help needed to walk in hospital room?: A Little Help needed climbing 3-5 steps with a railing? : A Little 6 Click Score: 20    End of Session Equipment Utilized During Treatment: Gait belt (cam boot) Activity Tolerance: Patient tolerated treatment well Patient left: in bed;with call bell/phone within reach Nurse Communication: Mobility status PT Visit Diagnosis: Other abnormalities of gait and mobility  (R26.89);Pain Pain - Right/Left: Right Pain - part of body: Leg    Time: 1320-1404 PT Time Calculation (min) (ACUTE ONLY): 44 min   Charges:   PT Evaluation $PT Eval Low Complexity: 1 Low PT Treatments $Gait Training: 8-22 mins $Therapeutic Activity: 8-22 mins        Van Clines, PT  Acute Rehabilitation Services Pager [phone] Office [phone]   Levi Aland 04/13/2020, 7:50 PM

## 2020-04-13 NOTE — TOC Initial Note (Signed)
Transition of Care [facility]) - Initial/Assessment Note    Patient Details  Name: Erin Leonard MRN: [id number] Date of Birth: [birth year]  Transition of Care [facility]) CM/SW Contact:    Janae Bridgeman, RN Phone Number: 04/13/2020, 12:29 PM  Clinical Narrative:                 Case management met with the patient at the bedside regarding transitions of care.  The patient plans on being discharged home tomorrow.  The patient states that she lives at home alone and cares for 5 children under the age of 33 years old, but she has her mother available for assistance.  The patient states that her mother drives and is able to assist her at home as needed, including driving her to outpatient visits.  The patient does not have a PCP and will be set up with [facility].  The patient is currently waiting on PT to evaluate today for PT/OT follow up and dme needs.  The patient is agreeable to outpatient PT referral if needed and the patient's mother is able to provide transportation assistance.  TOC will continue to follow for transition of care needs.  Expected Discharge Plan: Home/Self Care Barriers to Discharge: Continued Medical Work up   Patient Goals and CMS Choice Patient states their goals for this hospitalization and ongoing recovery are:: Patient plans to discharge home with possible OP PT rehab / ortho follow up. CMS Medicare.gov Compare Post Acute Care list provided to:: Patient Choice offered to / list presented to : Patient  Expected Discharge Plan and Services Expected Discharge Plan: Home/Self Care In-house Referral: PCP / [facility] Connect Discharge Planning Services: CM Consult,Follow-up appt scheduled   Living arrangements for the past 2 months: Single Family Home                                      Prior Living Arrangements/Services Living arrangements for the past 2 months: Single Family Home Lives with:: Self,Minor Children Patient  language and need for interpreter reviewed:: Yes Do you feel safe going back to the place where you live?: Yes      Need for Family Participation in Patient Care: Yes (Comment) Care giver support system in place?: Yes (comment)   Criminal Activity/Legal Involvement Pertinent to Current Situation/Hospitalization: No - Comment as needed  Activities of Daily Living Home Assistive Devices/Equipment: None ADL Screening (condition at time of admission) Patient's cognitive ability adequate to safely complete daily activities?: Yes Is the patient deaf or have difficulty hearing?: No Does the patient have difficulty seeing, even when wearing glasses/contacts?: No Does the patient have difficulty concentrating, remembering, or making decisions?: No Patient able to express need for assistance with ADLs?: Yes Does the patient have difficulty dressing or bathing?: No Independently performs ADLs?: Yes (appropriate for developmental age) Does the patient have difficulty walking or climbing stairs?: No Weakness of Legs: None Weakness of Arms/Hands: None  Permission Sought/Granted Permission sought to share information with : Case Manager Permission granted to share information with : Yes, Verbal Permission Granted     Permission granted to share info w AGENCY: PCP follow up with [facility]  Permission granted to share info w Relationship: Gloris Ham, mother - [phone]     Emotional Assessment Appearance:: Appears stated age Attitude/Demeanor/Rapport: Engaged Affect (typically observed): Accepting Orientation: : Oriented to Self,Oriented to Place,Oriented to  Time,Oriented to Situation Alcohol /  Substance Use: Not Applicable Psych Involvement: No (comment)  Admission diagnosis:  Type I or II open fracture of distal end of right tibia, unspecified fracture morphology, initial encounter [S82.301B] Observation after surgery [Z48.89] Status post surgery [Z98.890] Fracture of shaft of right tibia and  fibula, open type I or II, initial encounter [S82.201B, S82.401B] Patient Active Problem List   Diagnosis Date Noted  . Observation after surgery 04/12/2020  . Status post surgery 04/12/2020  . Fracture of shaft of right tibia and fibula, open type I or II, initial encounter 04/12/2020  . Request for sterilization 09/26/2018  . Labor and delivery indication for care or intervention 09/26/2018  . Calculus of kidney 05/02/2018  . Recurrent urinary tract infection 05/02/2018  . GBS bacteriuria 03/13/2018  . Supervision of other normal pregnancy, antepartum 03/09/2018   PCP:  Patient, No Pcp Per Pharmacy:   [facility] [place], [address] [place] [address] Phone: [phone] Fax: [phone]  [place] [place]  [address] Phone: [phone] Fax: [phone]     Social Determinants of Health (SDOH) Interventions    Readmission Risk Interventions Readmission Risk Prevention Plan 04/13/2020  Medication Screening Complete  Transportation Screening Complete  Some recent data might be hidden

## 2020-04-13 NOTE — Anesthesia Postprocedure Evaluation (Signed)
Anesthesia Post Note  Patient: Erin Leonard  Procedure(s) Performed: INTRAMEDULLARY (IM) NAIL TIBIAL, irrigation and debridement right lower leg (Right )     Patient location during evaluation: PACU Anesthesia Type: General Level of consciousness: awake Pain management: pain level controlled Vital Signs Assessment: post-procedure vital signs reviewed and stable Respiratory status: spontaneous breathing, nonlabored ventilation, respiratory function stable and patient connected to nasal cannula oxygen Cardiovascular status: blood pressure returned to baseline and stable Postop Assessment: no apparent nausea or vomiting Anesthetic complications: no   No complications documented.  Last Vitals:  Vitals:   04/12/20 2315 04/12/20 2335  BP: 124/87 128/79  Pulse: 94 67  Resp: 12 18  Temp: 36.8 C 36.8 C  SpO2: 97% 99%    Last Pain:  Vitals:   04/13/20 0100  TempSrc:   PainSc: 7                  Jill Stopka P Dollie Mayse

## 2020-04-13 NOTE — Progress Notes (Signed)
Orthopedic Tech Progress Note Patient Details:  Erin Leonard 1985-04-13 [id number]  Ortho Devices Type of Ortho Device: CAM walker Ortho Device/Splint Location: Right Lower Extremity Ortho Device/Splint Interventions: Ordered, Delivered to pt bedside to be applied by acute rehab staff       Gerald Stabs 04/13/2020, 1:10 PM

## 2020-04-14 MED ORDER — OXYCODONE HCL 5 MG PO TABS: 5.0000 mg | ORAL_TABLET | Freq: Four times a day (QID) | ORAL | 0 refills | Status: AC | PRN

## 2020-04-14 MED ORDER — METHOCARBAMOL 500 MG PO TABS: 500.0000 mg | ORAL_TABLET | Freq: Four times a day (QID) | ORAL | 1 refills | Status: AC | PRN

## 2020-04-14 MED ORDER — ONDANSETRON 4 MG PO TBDP: 4.0000 mg | ORAL_TABLET | Freq: Three times a day (TID) | ORAL | 0 refills | Status: AC | PRN

## 2020-04-14 NOTE — TOC Progression Note (Addendum)
Transition of Care [facility]) - Progression Note    Patient Details  Name: Erin Leonard MRN: [id number] Date of Birth: 11-07-1985  [facility] CM/SW Contact  Nadene Rubins Adria Devon, RN Phone Number: 04/14/2020, 10:28 AM  Clinical Narrative:    See previous TOC note.   Ordered walker and 3 in1.   PT recommended potential need for OP PT to be discussed at her follow up appointment with orthopedics.   Patient voiced understanding.     Expected Discharge Plan: Home/Self Care Barriers to Discharge: Continued Medical Work up  Expected Discharge Plan and Services Expected Discharge Plan: Home/Self Care In-house Referral: PCP / [facility] Connect Discharge Planning Services: CM Consult Post Acute Care Choice: Durable Medical Equipment Living arrangements for the past 2 months: Single Family Home                 DME Arranged: 3-N-1,Walker rolling DME Agency: [employer] Date DME Agency Contacted: 04/14/20 Time DME Agency Contacted: 1028 Representative spoke with at DME Agency: sheila HH Arranged: NA           Social Determinants of Health (SDOH) Interventions    Readmission Risk Interventions Readmission Risk Prevention Plan 04/13/2020  Medication Screening Complete  Transportation Screening Complete  Some recent data might be hidden

## 2020-04-14 NOTE — Progress Notes (Signed)
Physical Therapy Treatment Patient Details Name: Erin Leonard MRN: [id number] DOB: 04/19/1985 Today's Date: 04/14/2020    History of Present Illness Erin Leonard is a 35 y.o. female who complains of right leg pain and deformity after a rollerskating accident earlier today, sustaining a type I open distal tibia fracture of the right with proximal fibular fracture also the right was noted. S/p surgical fixation, 50% PWB in cam boot; She does live independently and has some small children.    PT Comments    Continuing work on functional mobility and activity tolerance;  Session focused on progressive amb, and pt walked more than household distance; Managing 50%PWB RLE well, occasional cues for managing RW, but still maintaining 50% weight; Questions solicited and answered, and we discussed car transfers; OK for dc home from PT standpoint    Follow Up Recommendations  Outpatient PT (The potential need for Outpatient PT can be addressed at Ortho follow-up appointments. )     Equipment Recommendations  Rolling walker with 5" wheels;3in1 (PT) (Delivered to room)    Recommendations for Other Services       Precautions / Restrictions Precautions Required Braces or Orthoses: Other Brace Other Brace: Cam boot RLE Restrictions RLE Weight Bearing: Partial weight bearing RLE Partial Weight Bearing Percentage or Pounds: 50    Mobility  Bed Mobility Overal bed mobility: Modified Independent       Supine to sit: Modified independent (Device/Increase time)     General bed mobility comments: Incr tiemand used rails    Transfers Overall transfer level: Needs assistance Equipment used: Rolling walker (2 wheeled) Transfers: Sit to/from Stand Sit to Stand: Supervision         General transfer comment: Cues for hand placement and safety; good maintenance of PWB with transitions  Ambulation/Gait Ambulation/Gait assistance: Supervision Gait Distance (Feet): 120 Feet Assistive  device: Rolling walker (2 wheeled) Gait Pattern/deviations: Step-through pattern     General Gait Details: cues for gait sequence and how to use RW to keep PWB   Stairs         General stair comments: Pt able to described backwards technqiue with RW, and she is confident in her ability to give her stair helper directions   Wheelchair Mobility    Modified Rankin (Stroke Patients Only)       Balance                                            Cognition Arousal/Alertness: Awake/alert Behavior During Therapy: WFL for tasks assessed/performed Overall Cognitive Status: Within Functional Limits for tasks assessed                                        Exercises      General Comments        Pertinent Vitals/Pain Pain Assessment: Faces Faces Pain Scale: Hurts a little bit Pain Location: R lower leg Pain Descriptors / Indicators: Aching Pain Intervention(s): Premedicated before session;Repositioned    Home Living                      Prior Function            PT Goals (current goals can now be found in the care plan section) Acute Rehab PT Goals Patient Stated Goal:  HOme soon PT Goal Formulation: With patient Time For Goal Achievement: 04/27/20 Potential to Achieve Goals: Good Progress towards PT goals: Progressing toward goals    Frequency    Min 6X/week      PT Plan Current plan remains appropriate    Co-evaluation              AM-PAC PT "6 Clicks" Mobility   Outcome Measure  Help needed turning from your back to your side while in a flat bed without using bedrails?: None Help needed moving from lying on your back to sitting on the side of a flat bed without using bedrails?: None Help needed moving to and from a bed to a chair (including a wheelchair)?: None Help needed standing up from a chair using your arms (e.g., wheelchair or bedside chair)?: None Help needed to walk in hospital room?: A  Little Help needed climbing 3-5 steps with a railing? : A Little 6 Click Score: 22    End of Session Equipment Utilized During Treatment: Gait belt (cam boot) Activity Tolerance: Patient tolerated treatment well Patient left: in chair;with call bell/phone within reach Nurse Communication: Mobility status PT Visit Diagnosis: Other abnormalities of gait and mobility (R26.89);Pain Pain - Right/Left: Right Pain - part of body: Leg     Time: 4158-3094 PT Time Calculation (min) (ACUTE ONLY): 28 min  Charges:  $Gait Training: 8-22 mins $Therapeutic Activity: 8-22 mins                     Van Clines, PT  Acute Rehabilitation Services Pager [phone] Office [phone]    Levi Aland 04/14/2020, 3:05 PM
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
## 2020-04-14 NOTE — Progress Notes (Signed)
Erin Leonard to be Discharged home per MD order.  Discussed prescriptions and follow up appointments with the patient. Prescriptions sent to patient's preferred pharmacy, medication list explained in detail. Pt verbalized understanding.  Allergies as of 04/14/2020      Reactions   Penicillins Hives, Rash   Tolerated Ancef on 04/12/20   Vancomycin Rash      Medication List    STOP taking these medications   acetaminophen 325 MG tablet Commonly known as: Tylenol   ibuprofen 600 MG tablet Commonly known as: ADVIL   metroNIDAZOLE 0.75 % vaginal gel Commonly known as: METROGEL VAGINAL   metroNIDAZOLE 500 MG tablet Commonly known as: FLAGYL   Prenatal Vitamins 28-0.8 MG Tabs     TAKE these medications   methocarbamol 500 MG tablet Commonly known as: Robaxin Take 1 tablet (500 mg total) by mouth every 6 (six) hours as needed for muscle spasms.   ondansetron 4 MG disintegrating tablet Commonly known as: Zofran ODT Take 1 tablet (4 mg total) by mouth every 8 (eight) hours as needed for nausea or vomiting.   oxyCODONE 5 MG immediate release tablet Commonly known as: Roxicodone Take 1 tablet (5 mg total) by mouth every 6 (six) hours as needed for up to 28 doses.            Durable Medical Equipment  (From admission, onward)         Start     Ordered   04/14/20 1027  For home use only DME 3 n 1  Once        04/14/20 1027   04/14/20 1027  For home use only DME Walker rolling  Once       Question Answer Comment  Walker: With 5 Inch Wheels   Patient needs a walker to treat with the following condition Weakness      04/14/20 1027          Vitals:   04/14/20 0811 04/14/20 1558  BP: 124/74 104/66  Pulse: 64 74  Resp: 18 17  Temp: 97.7 F (36.5 C) 98.7 F (37.1 C)  SpO2: 100% 100%    Skin clean, dry and intact without evidence of skin break down, no evidence of skin tears noted. IV catheter discontinued, Site without signs and symptoms of complications.  Dressing and pressure applied. Pt denies pain at this time. No complaints noted. Pt sent home with DME- (rolling walker & 3n1)  An After Visit Summary was printed and given to the patient. Patient escorted via Wheelchair, and Discharged home via private auto.  Erin Leonard [facility] 04/14/2020 7:11 PM

## 2020-04-14 NOTE — Plan of Care (Signed)
  Problem: Education: Goal: Knowledge of General Education information will improve Description: Including pain rating scale, medication(s)/side effects and non-pharmacologic comfort measures 04/14/2020 1851 by Lajean Saver, RN Outcome: Adequate for Discharge 04/14/2020 1802 by Lajean Saver, RN Outcome: Progressing   Problem: Health Behavior/Discharge Planning: Goal: Ability to manage health-related needs will improve Outcome: Adequate for Discharge   Problem: Clinical Measurements: Goal: Ability to maintain clinical measurements within normal limits will improve Outcome: Adequate for Discharge Goal: Will remain free from infection Outcome: Adequate for Discharge Goal: Diagnostic test results will improve Outcome: Adequate for Discharge Goal: Respiratory complications will improve 04/14/2020 1851 by Lajean Saver, RN Outcome: Adequate for Discharge 04/14/2020 1802 by Lajean Saver, RN Outcome: Progressing Goal: Cardiovascular complication will be avoided Outcome: Adequate for Discharge   Problem: Activity: Goal: Risk for activity intolerance will decrease 04/14/2020 1851 by Lajean Saver, RN Outcome: Adequate for Discharge 04/14/2020 1802 by Lajean Saver, RN Outcome: Progressing   Problem: Nutrition: Goal: Adequate nutrition will be maintained 04/14/2020 1851 by Lajean Saver, RN Outcome: Adequate for Discharge 04/14/2020 1802 by Lajean Saver, RN Outcome: Progressing   Problem: Coping: Goal: Level of anxiety will decrease 04/14/2020 1851 by Lajean Saver, RN Outcome: Adequate for Discharge 04/14/2020 1802 by Lajean Saver, RN Outcome: Progressing   Problem: Elimination: Goal: Will not experience complications related to bowel motility Outcome: Adequate for Discharge Goal: Will not experience complications related to urinary retention 04/14/2020 1851 by Lajean Saver, RN Outcome: Adequate for Discharge 04/14/2020 1802 by  Lajean Saver, RN Outcome: Progressing   Problem: Pain Managment: Goal: General experience of comfort will improve 04/14/2020 1851 by Lajean Saver, RN Outcome: Adequate for Discharge 04/14/2020 1802 by Lajean Saver, RN Outcome: Progressing   Problem: Safety: Goal: Ability to remain free from injury will improve 04/14/2020 1851 by Lajean Saver, RN Outcome: Adequate for Discharge 04/14/2020 1802 by Lajean Saver, RN Outcome: Progressing   Problem: Skin Integrity: Goal: Risk for impaired skin integrity will decrease Outcome: Adequate for Discharge   Problem: Acute Rehab PT Goals(only PT should resolve) Goal: Pt Will Go Supine/Side To Sit Outcome: Adequate for Discharge Goal: Patient Will Transfer Sit To/From Stand Outcome: Adequate for Discharge Goal: Pt Will Ambulate Outcome: Adequate for Discharge Goal: Pt Will Go Up/Down Stairs Outcome: Adequate for Discharge

## 2020-04-14 NOTE — Plan of Care (Signed)

## 2020-04-15 ENCOUNTER — Encounter (HOSPITAL_COMMUNITY): Payer: Self-pay | Admitting: Orthopedic Surgery

## 2020-04-17 ENCOUNTER — Encounter (HOSPITAL_COMMUNITY): Payer: Self-pay | Admitting: Orthopedic Surgery

## 2020-04-18 ENCOUNTER — Encounter (HOSPITAL_COMMUNITY): Payer: Self-pay | Admitting: Orthopedic Surgery

## 2021-03-25 IMAGING — RF DG C-ARM 1-60 MIN
1 series · 4 of 4 positions shown · non-contrast
Comparison: Earlier tonight

CLINICAL DATA: ORIF

EXAM:
RIGHT TIBIA AND FIBULA - 2 VIEW; DG C-ARM 1-60 MIN

[Series 1: unknown protocol · 0.14mm/px · 4 of 4 slices shown]
[im 1/4]
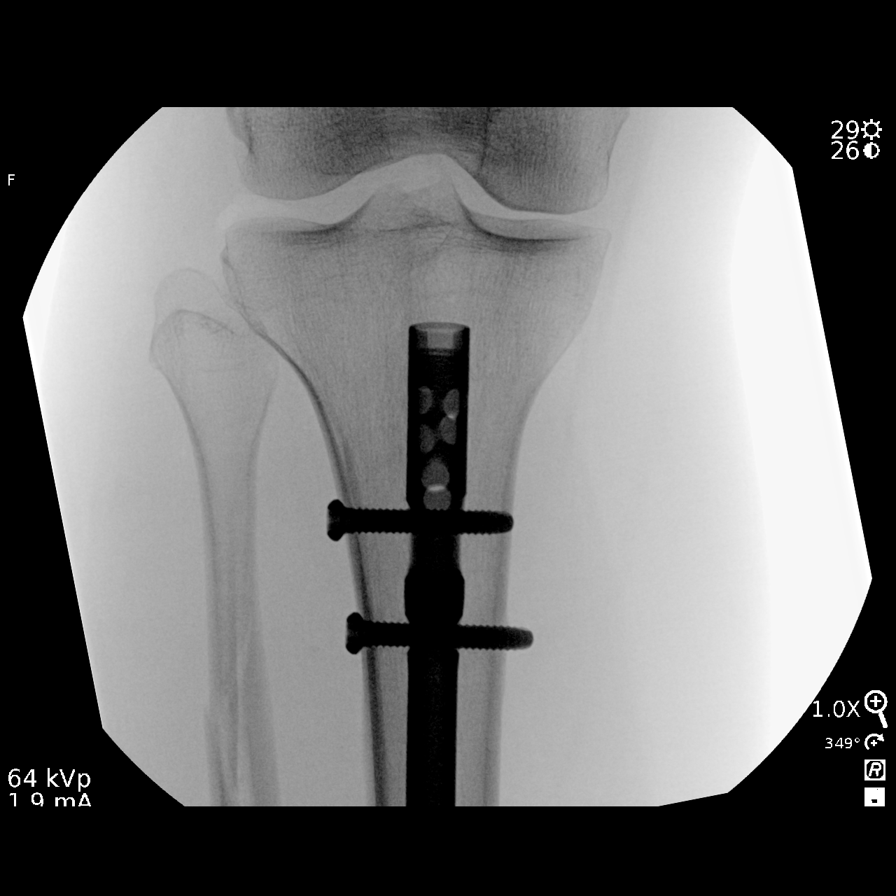
[im 2/4]
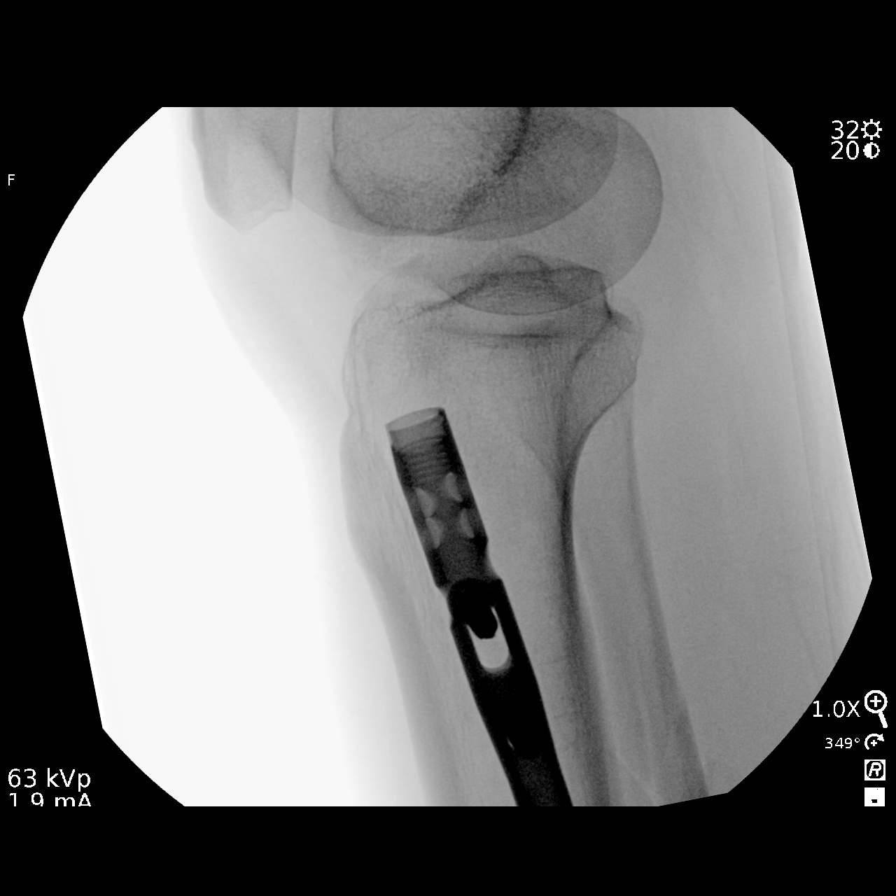
[im 3/4]
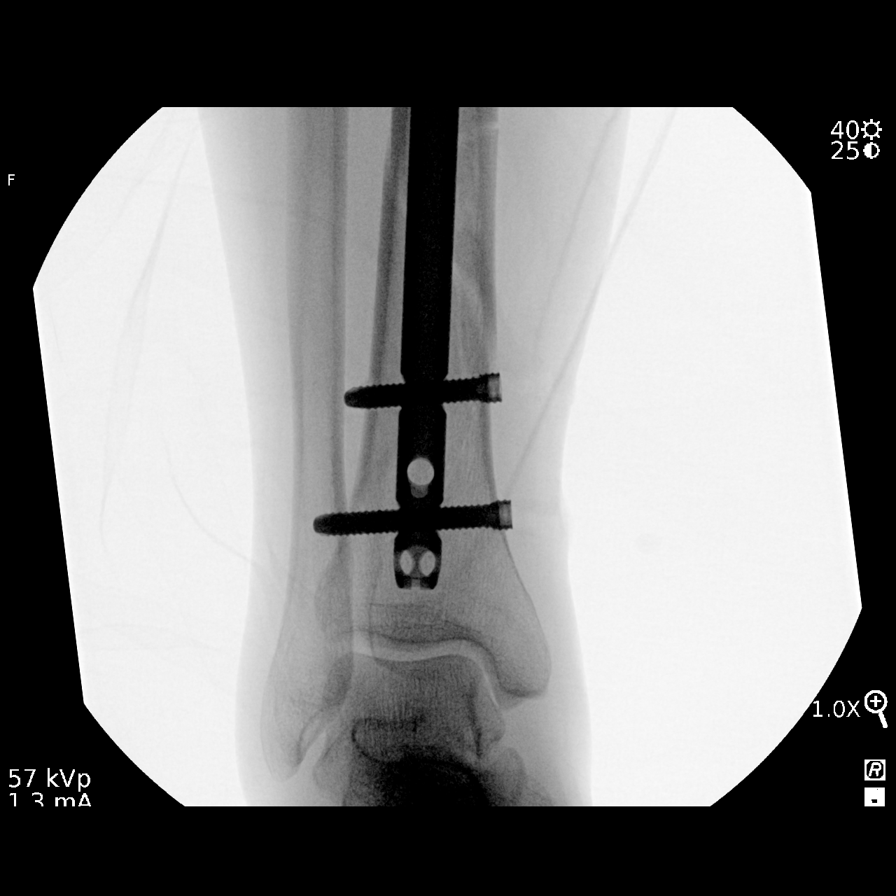
[im 4/4]
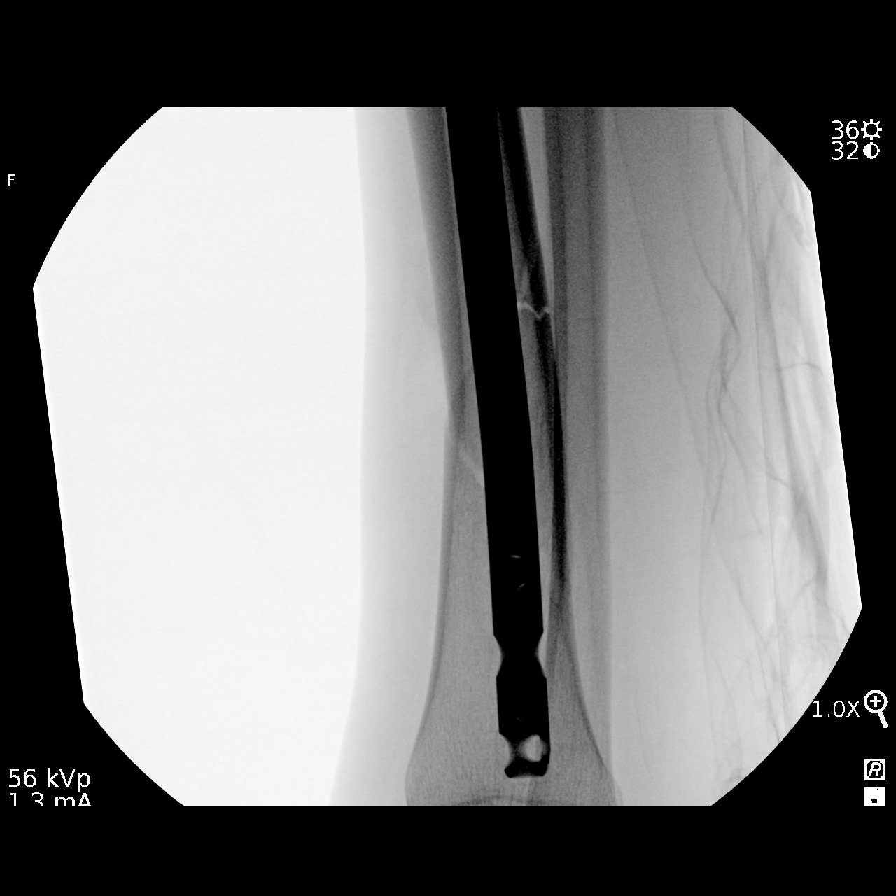

[4 of 4 positions shown; findings below may reference images not displayed]

FINDINGS: Multiple C-arm images show placement of an intramedullary nail
within the tibia with proximal and distal locking screws. Anatomic
alignment the distal tibial fracture.
IMPRESSION: Good appearance following ORIF of the distal tibial fracture.
